# Patient Record
Sex: Female | Born: 1970 | Race: White | Hispanic: No | Marital: Single | Smoking: Never smoker
Health system: Southern US, Community
[De-identification: ages and names within clinical notes are randomized; demographics above are authoritative.]

## PROBLEM LIST (undated history)

## (undated) DIAGNOSIS — M545 Low back pain, unspecified: Secondary | ICD-10-CM

## (undated) DIAGNOSIS — I10 Essential (primary) hypertension: Secondary | ICD-10-CM

## (undated) HISTORY — DX: Essential (primary) hypertension: I10

## (undated) HISTORY — PX: WISDOM TOOTH EXTRACTION: SHX21

---

## 2001-02-20 ENCOUNTER — Other Ambulatory Visit: Admission: RE | Admit: 2001-02-20 | Discharge: 2001-02-20 | Payer: Self-pay | Admitting: *Deleted

## 2002-03-31 ENCOUNTER — Other Ambulatory Visit: Admission: RE | Admit: 2002-03-31 | Discharge: 2002-03-31 | Payer: Self-pay | Admitting: Obstetrics & Gynecology

## 2011-04-25 ENCOUNTER — Other Ambulatory Visit: Payer: Self-pay | Admitting: Family Medicine

## 2011-04-25 DIAGNOSIS — Z1231 Encounter for screening mammogram for malignant neoplasm of breast: Secondary | ICD-10-CM

## 2011-05-09 ENCOUNTER — Ambulatory Visit
Admission: RE | Admit: 2011-05-09 | Discharge: 2011-05-09 | Disposition: A | Discharge status: Home / Self Care | Payer: BC Managed Care – PPO | Source: Ambulatory Visit | Attending: Family Medicine | Admitting: Family Medicine

## 2011-05-09 DIAGNOSIS — Z1231 Encounter for screening mammogram for malignant neoplasm of breast: Secondary | ICD-10-CM

## 2011-09-18 ENCOUNTER — Other Ambulatory Visit: Payer: Self-pay | Admitting: Family Medicine

## 2011-09-18 ENCOUNTER — Other Ambulatory Visit (HOSPITAL_COMMUNITY)
Admission: RE | Admit: 2011-09-18 | Discharge: 2011-09-18 | Disposition: A | Discharge status: Home / Self Care | Payer: BC Managed Care – PPO | Source: Ambulatory Visit | Attending: Family Medicine | Admitting: Family Medicine

## 2011-09-18 DIAGNOSIS — Z124 Encounter for screening for malignant neoplasm of cervix: Secondary | ICD-10-CM | POA: Insufficient documentation

## 2011-09-18 DIAGNOSIS — Z1159 Encounter for screening for other viral diseases: Secondary | ICD-10-CM | POA: Insufficient documentation

## 2011-11-25 ENCOUNTER — Ambulatory Visit
Admission: RE | Admit: 2011-11-25 | Discharge: 2011-11-25 | Disposition: A | Discharge status: Home / Self Care | Payer: BC Managed Care – PPO | Source: Ambulatory Visit | Attending: Family Medicine | Admitting: Family Medicine

## 2011-11-25 ENCOUNTER — Other Ambulatory Visit: Payer: Self-pay | Admitting: Family Medicine

## 2011-11-25 DIAGNOSIS — R609 Edema, unspecified: Secondary | ICD-10-CM

## 2012-04-17 ENCOUNTER — Other Ambulatory Visit: Payer: Self-pay | Admitting: Family Medicine

## 2012-04-17 DIAGNOSIS — Z1231 Encounter for screening mammogram for malignant neoplasm of breast: Secondary | ICD-10-CM

## 2012-05-06 ENCOUNTER — Ambulatory Visit: Payer: BC Managed Care – PPO | Admitting: Sports Medicine

## 2012-05-11 ENCOUNTER — Ambulatory Visit: Payer: BC Managed Care – PPO

## 2012-05-29 ENCOUNTER — Ambulatory Visit
Admission: RE | Admit: 2012-05-29 | Discharge: 2012-05-29 | Disposition: A | Discharge status: Home / Self Care | Payer: BC Managed Care – PPO | Source: Ambulatory Visit | Attending: Family Medicine | Admitting: Family Medicine

## 2012-05-29 DIAGNOSIS — Z1231 Encounter for screening mammogram for malignant neoplasm of breast: Secondary | ICD-10-CM

## 2012-06-03 ENCOUNTER — Ambulatory Visit (INDEPENDENT_AMBULATORY_CARE_PROVIDER_SITE_OTHER): Payer: BC Managed Care – PPO | Admitting: Sports Medicine

## 2012-06-03 ENCOUNTER — Encounter: Payer: Self-pay | Admitting: Sports Medicine

## 2012-06-03 VITALS — BP 132/84 | HR 82

## 2012-06-03 DIAGNOSIS — M216X9 Other acquired deformities of unspecified foot: Secondary | ICD-10-CM | POA: Insufficient documentation

## 2012-06-03 DIAGNOSIS — M25476 Effusion, unspecified foot: Secondary | ICD-10-CM | POA: Insufficient documentation

## 2012-06-03 DIAGNOSIS — R269 Unspecified abnormalities of gait and mobility: Secondary | ICD-10-CM

## 2012-06-03 DIAGNOSIS — M79673 Pain in unspecified foot: Secondary | ICD-10-CM | POA: Insufficient documentation

## 2012-06-03 NOTE — Assessment & Plan Note (Signed)
Patient has swelling of the foot joint over the second and third metatarsal base. This is likely dependent to a certain extent but also on ultrasound patient does have a branch of an arterial vessel but does transverse this area. If supports to the transverse arch is given patient arterial seems to come out of the joint which is beneficial. I think this is likely contributing to her swelling. Patient was given arch support bandage today. Patient was also given a sports insoles with heel lift as well as scaphoid pad and metatarsal pads. Patient had improvements in pronation bilaterally.

## 2012-06-03 NOTE — Assessment & Plan Note (Signed)
Scaphoid pads added 

## 2012-06-03 NOTE — Assessment & Plan Note (Signed)
If patient does notice improvement with correction so we made with her sports insoles we will then consider custom orthotics. Patient will get in touch with [place] within 4 weeks if she would like this made.

## 2012-06-03 NOTE — Assessment & Plan Note (Signed)
Metatarsal pads added and will see if helps.

## 2012-06-03 NOTE — Progress Notes (Signed)
Chief complaint: Left foot swelling on dorsal aspect  History of present illness: 41 year old female with no significant past medical history coming in with left foot swelling. Patient states approximately 6 months ago when she was in aerobic class she stepped down from a step and had midfoot pain mostly on the medial or tibial aspect of the foot. This occurred on the left foot. Patient saw her primary care provider as well as a podiatrist who did put her in a rigid orthotics. Patient states with time the midfoot pain went away but unfortunately she still had dorsal swelling of the foot. Patient has had x-rays of his foot done which were reviewed by me today. Patient did have dorsal foot swelling but otherwise no osseous abnormalities or fracture seen. Patient states that the pain has almost completely resolved but unfortunately she still has swelling. Patient states that it is a pocket distal to the midfoot and points towards the second and third toes. Patient states that the swelling does not have a lot of pain but continues to be something that occurs throughout the day and is worse at night. Patient appears that it does get better by the morning time. Patient denies any swelling anywhere else including the ankle in cast, patient also denies any shortness of breath or chest pain. Patient denies any fevers or chills and is still able to do all her activities of daily living with this swelling including working out.   Review of systems as above  Past medical history is unremarkable Past surgical history none Allergies: Minocycline Social history: Patient does not smoke drinks occasionally Family history denies any family history of diabetes heart disease but her mother does have hypertension.  Physical exam Blood pressure 132/84, pulse 82. Gen: No apparent distress alert and oriented x3 mood and affect are normal Respiratory: Patient speaks in full sentences and does not appear short of breath Skin:  Warm dry intact no signs of infection or rash Neuro: Cranial nerves II through XII are intact neurovascularly intact in all extremities. Patient also has 2+ DTRs in all extremities Foot exam: On standing patient does have overpronation of the ankles and hindfoot bilaterally. Patient also does have breakdown of the longitudinal and transverse arch bilaterally. 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 Patient has trace effusion between the second and third toes on the left side compared to the right side. Patient is nontender on exam with 5 out of 5 strength at the ankle and neurovascularly intact distally. Gait analysis: Patient does walk with overpronation still of the hindfoot bilaterally. Patient otherwise has a fairly normal gait with mild external rotation of the right tibia.  Ultrasound was done and interpreted by me today. Patient's ultrasound over the navicular bone on the dorsal aspect shows an area where she does have what appears to be hypoechoic changes with calcific changes representing a likely a healed stress reaction versus a ligamentous injury. When transverse in between the second and third toes patient does have a branch of an arterial vessel that can be seen within the joint. Otherwise scan is fairly unremarkable.

## 2013-02-16 ENCOUNTER — Ambulatory Visit: Payer: Self-pay | Admitting: Obstetrics and Gynecology

## 2013-02-23 ENCOUNTER — Encounter: Payer: Self-pay | Admitting: Obstetrics and Gynecology

## 2013-02-25 ENCOUNTER — Encounter: Payer: Self-pay | Admitting: Obstetrics and Gynecology

## 2013-02-26 ENCOUNTER — Other Ambulatory Visit: Payer: Self-pay | Admitting: Dermatology

## 2013-02-26 ENCOUNTER — Encounter: Payer: Self-pay | Admitting: Obstetrics and Gynecology

## 2013-02-26 ENCOUNTER — Ambulatory Visit (INDEPENDENT_AMBULATORY_CARE_PROVIDER_SITE_OTHER): Payer: BC Managed Care – PPO | Admitting: Obstetrics and Gynecology

## 2013-02-26 VITALS — BP 130/80 | HR 70 | Ht 66.0 in | Wt 206.0 lb

## 2013-02-26 DIAGNOSIS — Z01419 Encounter for gynecological examination (general) (routine) without abnormal findings: Secondary | ICD-10-CM

## 2013-02-26 DIAGNOSIS — Z0000 Encounter for general adult medical examination without abnormal findings: Secondary | ICD-10-CM

## 2013-02-26 DIAGNOSIS — R1900 Intra-abdominal and pelvic swelling, mass and lump, unspecified site: Secondary | ICD-10-CM

## 2013-02-26 LAB — POCT URINALYSIS DIPSTICK
Glucose, UA: NEGATIVE
Nitrite, UA: NEGATIVE
Urobilinogen, UA: NEGATIVE
pH, UA: 5

## 2013-02-26 LAB — CBC WITH DIFFERENTIAL/PLATELET
Basophils Relative: 1 % (ref 0–1)
Eosinophils Absolute: 0.1 10*3/uL (ref 0.0–0.7)
MCH: 29.3 pg (ref 26.0–34.0)
MCHC: 33.3 g/dL (ref 30.0–36.0)
Neutrophils Relative %: 66 % (ref 43–77)
Platelets: 236 10*3/uL (ref 150–400)

## 2013-02-26 NOTE — Progress Notes (Signed)
Patient ID: Samantha Schmitt, female   DOB: 18-Jun-1971, 42 y.o.   MRN: [id number] 42 y.o.   Single    Caucasian   female   LMP 02/25/13 G0P0   here for annual exam.    Regular menses.  No cramps.  Some bloating.   One or two hot flashes.  No night sweats.    Lost almost 50 pounds due to running and training. Ran a 1/2 marathon.   Patient's last menstrual period was 02/25/2013.          Sexually active: no  The current method of family planning is abstinence.    Exercising: running Last mammogram:  05/2012 -  [facility] - normal. Last pap smear: 11/2011 wnl.  High risk HPV testing. History of abnormal pap: no Smoking: no Alcohol: no Last colonoscopy: never Last Bone Density:  never Last tetanus shot: 2010 Last cholesterol check: with PCP 11/2011  Hgb:                Urine: 1+ RBC's(see LMP)   Family History  Problem Relation Age of Onset  . Hypertension Mother   . Thyroid disease Mother     Patient Active Problem List   Diagnosis Date Noted  . 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 Foot pain 06/03/2012  . Pronation of feet 06/03/2012  . Gait abnormality 06/03/2012  . Swelling of foot joint 06/03/2012    History reviewed. No pertinent past medical history.  Past Surgical History  Procedure Laterality Date  . Wisdom tooth extraction      Allergies: Minocycline  Current Outpatient Prescriptions  Medication Sig Dispense Refill  . Multiple Vitamin (MULTIVITAMIN) capsule Take 1 capsule by mouth daily.       No current facility-administered medications for this visit.    ROS: Pertinent items are noted in HPI.  Social Hx:    Exam:    BP 130/80  Pulse 70  Ht 5\' 6"  (1.676 m)  Wt 206 lb (93.441 kg)  BMI 33.27 kg/m2  LMP 02/25/2013   Wt Readings from Last 3 Encounters:  02/26/13 206 lb (93.441 kg)     Ht Readings from Last 3 Encounters:  02/26/13 5\' 6"  (1.676 m)    General appearance: alert, cooperative and appears stated age Head: Normocephalic,  without obvious abnormality, atraumatic Neck: no adenopathy, supple, symmetrical, trachea midline and thyroid not enlarged, symmetric, no tenderness/mass/nodules Lungs: clear to auscultation bilaterally Breasts: Inspection negative, No nipple retraction or dimpling, No nipple discharge or bleeding, No axillary or supraclavicular adenopathy, Normal to palpation without dominant masses Heart: regular rate and rhythm Abdomen: soft, non-tender; bowel sounds normal; no masses,  no organomegaly Extremities: extremities normal, atraumatic, no cyanosis or edema Skin: Skin color, texture, turgor normal. No rashes or lesions Lymph nodes: Cervical, supraclavicular, and axillary nodes normal. No abnormal inguinal nodes palpated Neurologic: Grossly normal   Pelvic: External genitalia:  no lesions              Urethra:  normal appearing urethra with no masses, tenderness or lesions              Bartholins and Skenes: normal                 Vagina: normal appearing vagina with normal color and discharge, no lesions              Cervix: normal appearance or posterior lip of cervix.  Unable to tolerate speculum exam well.  Pap taken: no        Bimanual Exam:  Uterus:  20 week size midline mass                                      Adnexa:  Unable to feel adnexa separately from the mass.                                      Rectovaginal: Confirms                                      Anus:  normal sphincter tone, no lesions  Assessment  Pelvic mass.  I suspect large uterine fibroids.   P: mammogram yearly pap smear per guidelines CBC, CMP Return for pelvic ultrasound and renal ultrasound in office to define pelvic anatomy and rule out hydronephrosis. I had a preliminary discussion with the patient regarding uterine fibroids and possible treatment options. I asked the patient to think about future fertility options as we progress through her evaluation. return annually or prn     An After  Visit Summary was printed and given to the patient.

## 2013-02-26 NOTE — Patient Instructions (Addendum)

## 2013-02-27 LAB — COMPREHENSIVE METABOLIC PANEL
ALT: 13 U/L (ref 0–35)
Alkaline Phosphatase: 39 U/L (ref 39–117)
Sodium: 139 mEq/L (ref 135–145)
Total Bilirubin: 0.3 mg/dL (ref 0.3–1.2)
Total Protein: 6.9 g/dL (ref 6.0–8.3)

## 2013-03-05 ENCOUNTER — Other Ambulatory Visit: Payer: Self-pay | Admitting: Orthopedic Surgery

## 2013-03-23 ENCOUNTER — Telehealth: Payer: Self-pay | Admitting: Obstetrics and Gynecology

## 2013-03-23 NOTE — Telephone Encounter (Signed)
Routed information to [employer].

## 2013-03-23 NOTE — Telephone Encounter (Signed)
Patient would like to reschedule PUS/Consult appt. Scheduled for 03/24/13.

## 2013-03-25 ENCOUNTER — Other Ambulatory Visit: Payer: BC Managed Care – PPO | Admitting: Obstetrics and Gynecology

## 2013-03-25 ENCOUNTER — Other Ambulatory Visit: Payer: BC Managed Care – PPO

## 2013-03-29 ENCOUNTER — Telehealth: Payer: Self-pay | Admitting: *Deleted

## 2013-03-29 NOTE — Telephone Encounter (Signed)
appt 04-01-13

## 2013-03-29 NOTE — Telephone Encounter (Signed)
PUS resched to this Thurs 04-01-13.

## 2013-04-01 ENCOUNTER — Ambulatory Visit (INDEPENDENT_AMBULATORY_CARE_PROVIDER_SITE_OTHER): Payer: BC Managed Care – PPO | Admitting: Obstetrics and Gynecology

## 2013-04-01 ENCOUNTER — Encounter: Payer: Self-pay | Admitting: Obstetrics and Gynecology

## 2013-04-01 ENCOUNTER — Other Ambulatory Visit: Payer: Self-pay | Admitting: Obstetrics and Gynecology

## 2013-04-01 ENCOUNTER — Ambulatory Visit (INDEPENDENT_AMBULATORY_CARE_PROVIDER_SITE_OTHER): Payer: BC Managed Care – PPO

## 2013-04-01 VITALS — BP 130/82 | HR 70 | Ht 66.0 in | Wt 209.5 lb

## 2013-04-01 DIAGNOSIS — D219 Benign neoplasm of connective and other soft tissue, unspecified: Secondary | ICD-10-CM

## 2013-04-01 DIAGNOSIS — D259 Leiomyoma of uterus, unspecified: Secondary | ICD-10-CM

## 2013-04-01 DIAGNOSIS — R1900 Intra-abdominal and pelvic swelling, mass and lump, unspecified site: Secondary | ICD-10-CM

## 2013-04-01 NOTE — Patient Instructions (Signed)
Please refer to your handout on uterine fibroids.

## 2013-04-01 NOTE — Progress Notes (Signed)
Subjective  Patient is here for pelvic ultrasound for suspected uterine fibroids. Is now more aware of the her enlarged uterus. Reports her menses are heavy but just thought this was normal.  Bleeds heavily for 2 days per month.  Hgb 13.4 one month ago. Rare cramping. Bloating.  Takes Midol or pamprin. Back pain, at times very significant.  Has a diagnosis of an extra vertebra.  Sees chiropractor.   Notes back pain with her menses.  Some bladder frequency.  Improved since decreased Diet Coke use.   Would like to consider parenting in some form.  Patient's father having a foot amputation next week.  He had a sprain and then developed osteomyelitis which is not related to any diabetes.   Objective  General - tearful when taking about her father's health issues and now her fibroids.   Pelvic exam -  Retroverted uterus.  20 week size.  Difficult to tell of the left lower quadrant fibroid is just above the level of the cervical os or not.   Pelvic ultrasound - see below  2 large intramural fibroids - left lower 9 cm, right upper 8 cm. A possible third fibroid was seen but no formally documented. Ovaries normal.   Renal ultrasound - no dilation of collecting systems.    Assessment  Large uterine fibroids - symptomatic. Consideration for potential future fertility.  Plan  The patient and I discussed options for treatment including contraception (pills, Depo Provera, et.), Depo Lupron, uterine artery embolization, MRI guided ultrasound therapy at a university, abdominal  Myomectomy, and abdominal hysterectomy.  We discussed benefits and risks of options.  At this point, patient is most interested in abdominal myomectomy.   We reviewed specific risks including fevers post op, bleeding requiring transfusion or hysterectomy, development of adhesive disease and decreased fertility related to this, uterine rupture, and need for future cesarean section.  Patient will return in 3 months  to have a follow up ultrasound and further discussion.   She wants to focus on her father right now and proceed through the Holiday season without any intervention if possible.

## 2013-04-16 ENCOUNTER — Telehealth: Payer: Self-pay | Admitting: Obstetrics and Gynecology

## 2013-04-16 NOTE — Telephone Encounter (Signed)
LMTCB to schedule 3 month repeat scan. (November)

## 2013-04-22 NOTE — Telephone Encounter (Signed)
Patient was returning someone's call was not sure who contacted her.

## 2013-04-22 NOTE — Telephone Encounter (Signed)
Routed to Carolyn/cm

## 2013-04-27 NOTE — Telephone Encounter (Signed)
Spoke with patient. Scheduled Samantha Schmitt for a 3 month follow up PUS on 11/20.

## 2013-06-17 ENCOUNTER — Other Ambulatory Visit: Payer: Self-pay

## 2013-06-23 ENCOUNTER — Telehealth: Payer: Self-pay | Admitting: Obstetrics and Gynecology

## 2013-06-23 NOTE — Telephone Encounter (Signed)
Patient calling to check on status of pre-cert for ultrasound.

## 2013-06-29 NOTE — Telephone Encounter (Signed)
Patient had to cancel her pus because she started her cycle. Can you call her to reschedule.

## 2013-07-01 ENCOUNTER — Other Ambulatory Visit: Payer: BC Managed Care – PPO | Admitting: Obstetrics and Gynecology

## 2013-07-01 ENCOUNTER — Other Ambulatory Visit: Payer: BC Managed Care – PPO

## 2013-07-02 NOTE — Telephone Encounter (Signed)
LMTCB to assist with rescheduling her PUS.

## 2013-07-20 ENCOUNTER — Other Ambulatory Visit: Payer: Self-pay

## 2013-07-20 DIAGNOSIS — Z1231 Encounter for screening mammogram for malignant neoplasm of breast: Secondary | ICD-10-CM

## 2013-08-13 ENCOUNTER — Ambulatory Visit
Admission: RE | Admit: 2013-08-13 | Discharge: 2013-08-13 | Disposition: A | Discharge status: Home / Self Care | Payer: BC Managed Care – PPO | Source: Ambulatory Visit

## 2013-08-13 DIAGNOSIS — Z1231 Encounter for screening mammogram for malignant neoplasm of breast: Secondary | ICD-10-CM

## 2013-08-19 ENCOUNTER — Other Ambulatory Visit: Payer: Self-pay | Admitting: Family Medicine

## 2013-08-19 DIAGNOSIS — R928 Other abnormal and inconclusive findings on diagnostic imaging of breast: Secondary | ICD-10-CM

## 2013-08-25 ENCOUNTER — Telehealth: Payer: Self-pay | Admitting: Obstetrics and Gynecology

## 2013-08-25 NOTE — Telephone Encounter (Signed)
Patient is calling Sabrina back °

## 2013-08-25 NOTE — Telephone Encounter (Signed)
Voicemail confirmed mobile #/ left message for patient to call back to schedule PUS/ pr $[email or web address]//ssf

## 2013-08-27 ENCOUNTER — Ambulatory Visit
Admission: RE | Admit: 2013-08-27 | Discharge: 2013-08-27 | Disposition: A | Discharge status: Home / Self Care | Payer: BC Managed Care – PPO | Source: Ambulatory Visit | Attending: Family Medicine | Admitting: Family Medicine

## 2013-08-27 DIAGNOSIS — R928 Other abnormal and inconclusive findings on diagnostic imaging of breast: Secondary | ICD-10-CM

## 2013-09-03 NOTE — Telephone Encounter (Signed)
Patient returned call/ scheduled pus/ advised of copay, cancellation policy and cancellation fee./ssf

## 2013-09-23 ENCOUNTER — Ambulatory Visit (INDEPENDENT_AMBULATORY_CARE_PROVIDER_SITE_OTHER): Payer: BC Managed Care – PPO | Admitting: Obstetrics and Gynecology

## 2013-09-23 ENCOUNTER — Encounter: Payer: Self-pay | Admitting: Obstetrics and Gynecology

## 2013-09-23 ENCOUNTER — Ambulatory Visit (INDEPENDENT_AMBULATORY_CARE_PROVIDER_SITE_OTHER): Payer: BC Managed Care – PPO

## 2013-09-23 VITALS — BP 134/90 | Wt 212.0 lb

## 2013-09-23 DIAGNOSIS — D219 Benign neoplasm of connective and other soft tissue, unspecified: Secondary | ICD-10-CM

## 2013-09-23 DIAGNOSIS — D259 Leiomyoma of uterus, unspecified: Secondary | ICD-10-CM

## 2013-09-23 NOTE — Patient Instructions (Signed)
Call if you develop increasing pressure, increased or irregular bleeding, back pain, or urinary frequency.

## 2013-09-23 NOTE — Progress Notes (Signed)
Subjective  Patient is here for pelvic ultrasound follow up of large uterine fibroids.  Menses lasting 7 - 8 days, not heavy.  Some pressure with menses. Decline future childbearing.  Objective  Ultrasound   4 fibroids -  6.2 cm, 4.1 cm - 2 separate fibroids, left, lateral, previously seen as one fibroid.  No change overall.  8.1 cm - stable  2.56 cm - right, newly measured fibroid  Endometrium distorted.  Ovaries normal.    Assessment  Stable uterine fibroids.  Relatively asymptomatic.   Plan  Discussed options for care - hormonal contraceptives for control of bleeding, myomectomy, uterine artery embolization, hysterectomy.   I discussed low risk of hidden sarcoma. Patient elects observation at this time.  Follow up in July 2015 for annual examination. May do an additional 6 months follow up ultrasound.  15 minutes face to face time of which over 505 was spent in counseling.   After visit summary to patient.

## 2013-11-24 ENCOUNTER — Encounter: Payer: Self-pay | Admitting: Obstetrics and Gynecology

## 2013-11-30 ENCOUNTER — Other Ambulatory Visit: Payer: Self-pay | Admitting: Chiropractic Medicine

## 2013-11-30 DIAGNOSIS — M531 Cervicobrachial syndrome: Secondary | ICD-10-CM

## 2013-12-01 ENCOUNTER — Other Ambulatory Visit: Payer: BC Managed Care – PPO

## 2013-12-03 ENCOUNTER — Ambulatory Visit
Admission: RE | Admit: 2013-12-03 | Discharge: 2013-12-03 | Disposition: A | Discharge status: Home / Self Care | Payer: BC Managed Care – PPO | Source: Ambulatory Visit | Attending: Chiropractic Medicine | Admitting: Chiropractic Medicine

## 2013-12-03 DIAGNOSIS — M531 Cervicobrachial syndrome: Secondary | ICD-10-CM

## 2014-03-04 ENCOUNTER — Ambulatory Visit: Payer: BC Managed Care – PPO | Admitting: Obstetrics and Gynecology

## 2014-03-11 ENCOUNTER — Ambulatory Visit (INDEPENDENT_AMBULATORY_CARE_PROVIDER_SITE_OTHER): Payer: BC Managed Care – PPO | Admitting: Obstetrics and Gynecology

## 2014-03-11 ENCOUNTER — Encounter: Payer: Self-pay | Admitting: Obstetrics and Gynecology

## 2014-03-11 VITALS — BP 128/82 | HR 72 | Ht 66.0 in | Wt 211.0 lb

## 2014-03-11 DIAGNOSIS — D251 Intramural leiomyoma of uterus: Secondary | ICD-10-CM

## 2014-03-11 DIAGNOSIS — Z01419 Encounter for gynecological examination (general) (routine) without abnormal findings: Secondary | ICD-10-CM

## 2014-03-11 LAB — POCT URINALYSIS DIPSTICK
Bilirubin, UA: NEGATIVE
Blood, UA: NEGATIVE
Glucose, UA: NEGATIVE
LEUKOCYTES UA: NEGATIVE
NITRITE UA: NEGATIVE
PROTEIN UA: NEGATIVE
Urobilinogen, UA: NEGATIVE
pH, UA: 7

## 2014-03-11 NOTE — Patient Instructions (Signed)
EXERCISE AND DIET:  We recommended that you start or continue a regular exercise program for good health. Regular exercise means any activity that makes your heart beat faster and makes you sweat.  We recommend exercising at least 30 minutes per day at least 3 days a week, preferably 4 or 5.  We also recommend a diet low in fat and sugar.  Inactivity, poor dietary choices and obesity can cause diabetes, heart attack, stroke, and kidney damage, among others.    ALCOHOL AND SMOKING:  Women should limit their alcohol intake to no more than 7 drinks/beers/glasses of wine (combined, not each!) per week. Moderation of alcohol intake to this level decreases your risk of breast cancer and liver damage. And of course, no recreational drugs are part of a healthy lifestyle.  And absolutely no smoking or even second hand smoke. Most people know smoking can cause heart and lung diseases, but did you know it also contributes to weakening of your bones? Aging of your skin?  Yellowing of your teeth and nails?  CALCIUM AND VITAMIN D:  Adequate intake of calcium and Vitamin D are recommended.  The recommendations for exact amounts of these supplements seem to change often, but generally speaking 600 mg of calcium (either carbonate or citrate) and 800 units of Vitamin D per day seems prudent. Certain women may benefit from higher intake of Vitamin D.  If you are among these women, your doctor will have told you during your visit.    PAP SMEARS:  Pap smears, to check for cervical cancer or precancers,  have traditionally been done yearly, although recent scientific advances have shown that most women can have pap smears less often.  However, every woman still should have a physical exam from her gynecologist every year. It will include a breast check, inspection of the vulva and vagina to check for abnormal growths or skin changes, a visual exam of the cervix, and then an exam to evaluate the size and shape of the uterus and  ovaries.  And after 43 years of age, a rectal exam is indicated to check for rectal cancers. We will also provide age appropriate advice regarding health maintenance, like when you should have certain vaccines, screening for sexually transmitted diseases, bone density testing, colonoscopy, mammograms, etc.   MAMMOGRAMS:  All women over 40 years old should have a yearly mammogram. Many facilities now offer a "3D" mammogram, which may cost around $50 extra out of pocket. If possible,  we recommend you accept the option to have the 3D mammogram performed.  It both reduces the number of women who will be called back for extra views which then turn out to be normal, and it is better than the routine mammogram at detecting truly abnormal areas.    COLONOSCOPY:  Colonoscopy to screen for colon cancer is recommended for all women at age 50.  We know, you hate the idea of the prep.  We agree, BUT, having colon cancer and not knowing it is worse!!  Colon cancer so often starts as a polyp that can be seen and removed at colonscopy, which can quite literally save your life!  And if your first colonoscopy is normal and you have no family history of colon cancer, most women don't have to have it again for 10 years.  Once every ten years, you can do something that may end up saving your life, right?  We will be happy to help you get it scheduled when you are ready.    Be sure to check your insurance coverage so you understand how much it will cost.  It may be covered as a preventative service at no cost, but you should check your particular policy.     Leuprolide depot injection or implant What is this medicine? LEUPROLIDE (loo PROE lide) is a man-made protein that acts like a natural hormone in the body. It decreases testosterone in men and decreases estrogen in women. In men, this medicine is used to treat advanced prostate cancer. In women, some forms of this medicine may be used to treat endometriosis, uterine fibroids,  or other female hormone-related problems. This medicine may be used for other purposes; ask your health care provider or pharmacist if you have questions. COMMON BRAND NAME(S): Eligard, Lupron Depot, Lupron Depot-Ped, Viadur What should I tell my health care provider before I take this medicine? They need to know if you have any of these conditions: -diabetes -heart disease or previous heart attack -high blood pressure -high cholesterol -osteoporosis -pain or difficulty passing urine -spinal cord metastasis -stroke -tobacco smoker -unusual vaginal bleeding (women) -an unusual or allergic reaction to leuprolide, benzyl alcohol, other medicines, foods, dyes, or preservatives -pregnant or trying to get pregnant -breast-feeding How should I use this medicine? This medicine is for injection into a muscle or for implant or injection under the skin. It is given by a health care professional in a hospital or clinic setting. The specific product will determine how it will be given to you. Make sure you understand which product you receive and how often you will receive it. Talk to your pediatrician regarding the use of this medicine in children. Special care may be needed. Overdosage: If you think you have taken too much of this medicine contact a poison control center or emergency room at once. NOTE: This medicine is only for you. Do not share this medicine with others. What if I miss a dose? It is important not to miss a dose. Call your doctor or health care professional if you are unable to keep an appointment. Depot injections: Depot injections are given either once-monthly, every 12 weeks, every 16 weeks, or every 24 weeks depending on the product you are prescribed. The product you are prescribed will be based on if you are female or female, and your condition. Make sure you understand your product and dosing. Implant dosing: The implant is removed and replaced once a year. The implant is only  used in males. What may interact with this medicine? Do not take this medicine with any of the following medications: -chasteberry This medicine may also interact with the following medications: -herbal or dietary supplements, like black cohosh or DHEA -female hormones, like estrogens or progestins and birth control pills, patches, rings, or injections -female hormones, like testosterone This list may not describe all possible interactions. Give your health care provider a list of all the medicines, herbs, non-prescription drugs, or dietary supplements you use. Also tell them if you smoke, drink alcohol, or use illegal drugs. Some items may interact with your medicine. What should I watch for while using this medicine? Visit your doctor or health care professional for regular checks on your progress. During the first weeks of treatment, your symptoms may get worse, but then will improve as you continue your treatment. You may get hot flashes, increased bone pain, increased difficulty passing urine, or an aggravation of nerve symptoms. Discuss these effects with your doctor or health care professional, some of them may improve with continued  use of this medicine. Female patients may experience a menstrual cycle or spotting during the first months of therapy with this medicine. If this continues, contact your doctor or health care professional. What side effects may I notice from receiving this medicine? Side effects that you should report to your doctor or health care professional as soon as possible: -allergic reactions like skin rash, itching or hives, swelling of the face, lips, or tongue -breathing problems -chest pain -depression or memory disorders -pain in your legs or groin -pain at site where injected or implanted -severe headache -swelling of the feet and legs -visual changes -vomiting Side effects that usually do not require medical attention (report to your doctor or health care  professional if they continue or are bothersome): -breast swelling or tenderness -decrease in sex drive or performance -diarrhea -hot flashes -loss of appetite -muscle, joint, or bone pains -nausea -redness or irritation at site where injected or implanted -skin problems or acne This list may not describe all possible side effects. Call your doctor for medical advice about side effects. You may report side effects to FDA at 1-800-FDA-1088. Where should I keep my medicine? This drug is given in a hospital or clinic and will not be stored at home. NOTE: This sheet is a summary. It may not cover all possible information. If you have questions about this medicine, talk to your doctor, pharmacist, or health care provider.  2015, Elsevier/Gold Standard. (2010-01-30 14:41:21)  Uterine Artery Embolization for Fibroids Uterine artery embolization is a nonsurgical treatment to shrink fibroids. A thin plastic tube (catheter) is used to inject material that blocks off the blood supply to the fibroid, which causes the fibroid to shrink. LET [facility] CARE PROVIDER KNOW ABOUT:  Any allergies you have.  All medicines you are taking, including vitamins, herbs, eye drops, creams, and over-the-counter medicines.  Previous problems you or members of your family have had with the use of anesthetics.  Any blood disorders you have.  Previous surgeries you have had.  Medical conditions you have. RISKS AND COMPLICATIONS  Injury to the uterus from decreased blood supply  Infection.  Blood infection (septicemia).  Lack of menstrual periods (amenorrhea).  Death of tissue cells (necrosis) around your bladder or vulva.  Development of a hole between organs or from an organ to the surface of your skin (fistula).  Blood clot in the legs (deep vein thrombosis) or lung (pulmonary embolus). BEFORE THE PROCEDURE  Ask your health care provider about changing or stopping your regular medicines.   Do  not take aspirin or blood thinners (anticoagulants) for 1 week before the surgery or as directed by your health care provider.  Do not eat or drink anything for 8 hours before the surgery or as directed by your health care provider.   Empty your bladder before the procedure begins. PROCEDURE   An IV tube will be placed into one of your veins. This will be used to give you a sedative and pain medication (conscious sedation).  You will be given a medicine that numbs the area (local anesthetic).  A small cut will be made in your groin. A catheter is then inserted into the main artery of your leg.  The catheter will be guided through the artery to your uterus. A series of images will be taken while dye is injected through the catheter in your groin. X-rays are taken at the same time. This is done to provide a road map of the blood supply to your  uterus and fibroids.  Tiny plastic spheres, about the size of sand grains, will be injected through the catheter. Metal coils may be used to help block the artery. The particles will lodge in tiny branches of the uterine artery that supplies blood to the fibroids.  The procedure is repeated on the artery that supplies the other side of the uterus.  The catheter is then removed and pressure is held to stop any bleeding. No stitches are needed.  A dressing is then placed over the cut (incision). AFTER THE PROCEDURE  You will be taken to a recovery area where your progress will be monitored until you are awake, stable, and taking fluids well. If there are no other problems, you will then be moved to a regular hospital room.  You will be observed overnight in the hospital.  You will have cramping that should be controlled with pain medication. Document Released: 10/14/2005 Document Revised: 05/19/2013 Document Reviewed: 02/11/2013 [facility] Patient Information 2015 [place], [place]. This information is not intended to replace advice given to you by your  health care provider. Make sure you discuss any questions you have with your health care provider.

## 2014-03-11 NOTE — Progress Notes (Signed)
GYNECOLOGY VISIT  PCP: Dr.McNeil  Referring provider:   HPI: 43 y.o.   Single  Caucasian  female   G0P0 with Patient's last menstrual period was 02/26/2014.   here for   Annual  Menses 8 days and regular.  No heavy or crampy bleeding.  Some low  Back pain.  Has large uterine fibroids and is planning for eventual hysterectomy. Not planning on future childbearing.   Has a pinched nerve of the left foot.  Sees [facility]. Going to [facility].   Hgb:  Labs will do with PCP. Urine:  Moderate ketones ph-7.0  GYNECOLOGIC HISTORY: Patient's last menstrual period was 02/26/2014. Sexually active:  no Partner preference:female  Contraception:   none Menopausal hormone therapy: no  DES exposure:   no Blood transfusions:no    Sexually transmitted diseases:   no GYN procedures and prior surgeries:  no Last mammogram:  08/27/13 BI-RADS Neg               Last pap and high risk HPV testing:   11/2011 wnl High risk HPV testing History of abnormal pap smear:  no   OB History   Grav Para Term Preterm Abortions TAB SAB Ect Mult Living   0                LIFESTYLE: Exercise:    Yes, walking and running           Tobacco: no Alcohol:no Drug use:no    OTHER HEALTH MAINTENANCE: Tetanus/TDap: 2014 - PCP Gardisil:no Influenza: 10/14  Zostavax:no  Bone density:no Colonoscopy:no  Cholesterol check: PCP  Family History  Problem Relation Age of Onset  . Hypertension Mother   . Thyroid disease Mother     Patient Active Problem List   Diagnosis Date Noted  . Fibroids 09/23/2013  . 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 Foot pain 06/03/2012  . Pronation of feet 06/03/2012  . Gait abnormality 06/03/2012  . Swelling of foot joint 06/03/2012   History reviewed. No pertinent past medical history.  Past Surgical History  Procedure Laterality Date  . Wisdom tooth extraction      ALLERGIES: Minocycline  Current Outpatient Prescriptions  Medication Sig  Dispense Refill  . Methylsulfonylmethane (MSM PO) Take by mouth daily.      . Multiple Vitamin (MULTIVITAMIN) capsule Take 1 capsule by mouth daily.      . Omega-3 Fatty Acids (FISH OIL) 1000 MG CAPS Take by mouth. Take one every other day      . vitamin E 200 UNIT capsule Take 200 Units by mouth daily.       No current facility-administered medications for this visit.     ROS:  Pertinent items are noted in HPI.  SOCIAL HISTORY:  Company secretary.  PHYSICAL EXAMINATION:    BP 128/82  Pulse 72  Ht 5\' 6"  (1.676 m)  Wt 211 lb (95.709 kg)  BMI 34.07 kg/m2  LMP 02/26/2014   Wt Readings from Last 3 Encounters:  03/11/14 211 lb (95.709 kg)  09/23/13 212 lb (96.163 kg)  04/01/13 209 lb 8 oz (95.029 kg)     Ht Readings from Last 3 Encounters:  03/11/14 5\' 6"  (1.676 m)  04/01/13 5\' 6"  (1.676 m)  02/26/13 5\' 6"  (1.676 m)    General appearance: alert, cooperative and appears stated age Head: Normocephalic, without obvious abnormality, atraumatic Neck: no adenopathy, supple, symmetrical, trachea midline and thyroid not enlarged, symmetric, no tenderness/mass/nodules Lungs: clear to auscultation bilaterally Breasts: Inspection negative, No  nipple retraction or dimpling, No nipple discharge or bleeding, No axillary or supraclavicular adenopathy, Normal to palpation without dominant masses Heart: regular rate and rhythm Abdomen: soft, non-tender;  Uterus 20 week size, left fundus slightly above the umbilicus.  Extremities: extremities normal, atraumatic, no cyanosis or edema Skin: Skin color, texture, turgor normal. No rashes or lesions Lymph nodes: Cervical, supraclavicular, and axillary nodes normal. No abnormal inguinal nodes palpated Neurologic: Grossly normal  Pelvic: External genitalia:  no lesions              Urethra:  normal appearing urethra with no masses, tenderness or lesions              Bartholins and Skenes: normal                 Vagina: normal appearing vagina with normal  color and discharge, no lesions              Cervix: normal appearance              Pap and high risk HPV testing done: No..            Bimanual Exam:  Uterus:  uterus is  20 - 21 week size.                                       Adnexa: normal adnexa in size, nontender and no masses                                      Rectovaginal: Confirms                                      Anus:  normal sphincter tone, no lesions  ASSESSMENT  Normal gynecologic exam. Large uterine fibroids.   PLAN  Mammogram recommended yearly.  Pap smear and high risk HPV testing not indicated.  Counseled on self breast exam, Calcium and vitamin D intake, exercise. Discussed uterine fibroids and hysterectomy vs. Uterine artery embolization.  I recommend Depo Lupron for 3 - 6 months prior to procedure to shrink uterus and fibroids.  I discussed side effects and gave information to patient.  Will start precert for Depo Lupron.  Tentative hysterectomy for January, February, or March per patient.  Return annually or prn   An After Visit Summary was printed and given to the patient.

## 2014-03-30 ENCOUNTER — Telehealth: Payer: Self-pay | Admitting: *Deleted

## 2014-03-30 NOTE — Telephone Encounter (Signed)
Faxed authorization for Lupron received from [facility]. Call to patient to check on shipment and cycle expected dates. LMTCB.

## 2014-03-31 NOTE — Telephone Encounter (Signed)
[facility] calling, They have authorization from patient for shipment. Advised can ship at first available time per Gay Filler so that Lupron is here for patient's next menstrual cycle.  They will ship tomorrow for arrival on Tuesday. Pt to call with first day of menses for nurse appointment for injection within first 5 days of cycle. Patient notified and she is agreeable to instructions.  Will call back on first day of menses.

## 2014-04-01 NOTE — Telephone Encounter (Signed)
Great! Thanks for the update.

## 2014-04-19 ENCOUNTER — Telehealth: Payer: Self-pay | Admitting: Obstetrics and Gynecology

## 2014-04-19 NOTE — Telephone Encounter (Signed)
Spoke with patient. Patient started cycle on 9/5. Appointment scheduled for tomorrow at 9:30am for nurse visit for lupron injection. Agreeable to date and time.  Routing to provider for final review. Patient agreeable to disposition. Will close encounter

## 2014-04-19 NOTE — Telephone Encounter (Signed)
Patient started her cycle 04/16/14 and is calling to schedule "Depo Lupron."

## 2014-04-19 NOTE — Telephone Encounter (Signed)
Patient is returning a call to Kaitlyn. °

## 2014-04-19 NOTE — Telephone Encounter (Signed)
Left message to call [facility] at [phone]. Need to get patient in by tomorrow for nurse visit for Lupron injection.

## 2014-04-20 ENCOUNTER — Ambulatory Visit (INDEPENDENT_AMBULATORY_CARE_PROVIDER_SITE_OTHER): Payer: BC Managed Care – PPO

## 2014-04-20 VITALS — BP 138/90 | HR 72 | Ht 66.0 in | Wt 213.0 lb

## 2014-04-20 DIAGNOSIS — D259 Leiomyoma of uterus, unspecified: Secondary | ICD-10-CM

## 2014-04-20 MED ORDER — LEUPROLIDE ACETATE (3 MONTH) 11.25 MG IM KIT
11.2500 mg | PACK | Freq: Once | INTRAMUSCULAR | Status: AC
  Administered 2014-04-20: 11.25 mg via INTRAMUSCULAR

## 2014-04-20 MED ORDER — LEUPROLIDE ACETATE (3 MONTH) 11.25 MG IM KIT: 11.2500 mg | PACK | INTRAMUSCULAR | Status: DC

## 2014-04-20 NOTE — Progress Notes (Signed)
Pt is here for a Lupron injection. Pt cycle started Sept. 5th, 2015. Pt was told by Gay Filler to come in within 5 days of her cycle. Pt waited 20 mins for any reactions. Pt tolerated injection well.

## 2014-05-13 ENCOUNTER — Telehealth: Payer: Self-pay | Admitting: Obstetrics and Gynecology

## 2014-05-13 NOTE — Telephone Encounter (Signed)
Pharmacy called to verify whether patient still needs Lupron. There was a note on the prescription to verify before processing. They pharmacist said when we call back to just say "cancel or keep processing" as no other information is required.

## 2014-05-16 NOTE — Telephone Encounter (Signed)
Patient will receive only the two injections in total. Will need to start thinking about surgery for January 2016 for total abdominal hysterectomy with bilateral salpingectomy for symptomatic fibroids.  I am not certain of the patient's insurance and how this will affect any pre-certification process. Thanks.   Cc - Sabrina Felisa Bonier

## 2014-05-16 NOTE — Telephone Encounter (Signed)
Dr. Quincy Simmonds,  Will patient continue with 6 months of Therapy for Lupron?  Patient had injection on 04/20/14, will need second injection after 07/20/14?

## 2014-05-17 ENCOUNTER — Telehealth: Payer: Self-pay | Admitting: *Deleted

## 2014-05-17 NOTE — Telephone Encounter (Signed)
I would recommend proceeding with the second injection, as her surgery will be in January at the soonest. A follow up visit in December will be helpful to re-examine the patient and prepare for surgery after the New Year, i.e. Make final decision regarding abdominal or laparoscopic approach to hysterectomy.  Thank you!

## 2014-05-17 NOTE — Telephone Encounter (Signed)
Return call to [place]. Advised that MD did order two injections at onset of therapy but first inject was just given in 04-18-14 so it is early to begin planning for next shipment. Per Fara Olden, they are placing call on behalf of patient who has some questions about therapy. Again advised joel that although two injections were originally ordered, it is too early to tell if patient will elect to continue therapy and we will follow up with patient directly.  Routing to provider for final review. Patient agreeable to disposition. Will close encounter

## 2014-05-17 NOTE — Telephone Encounter (Signed)
See previous phone note. Pharmacy said patient had questions about Lupron therapy. Call to patient. She states [facility] started calling her two weeks ago about dispensing her next injection.  Patient only said to call office because she was surprised they were trying to ship so soon and once it is shipped she has to pay for it even if not used. She is still having issues with bill between [facility] and [facility] from 1st injection. Advised it is currently our plan to continue with second injection for a total of 6 months of Lupron if she is tolerating it well. Patient states she is currently doing well. Has had one cycle, a little earlier and lighter than normal. Aware this is to be expected. States that she is having a few hot flashes but it is tolerable. Some increased irritability but it is manageable. Advised will provide Dr Quincy Simmonds with update and see if anything is needed before next injection due approximately 07-18-14.

## 2014-06-15 ENCOUNTER — Telehealth: Payer: Self-pay | Admitting: *Deleted

## 2014-06-15 DIAGNOSIS — D259 Leiomyoma of uterus, unspecified: Secondary | ICD-10-CM

## 2014-06-15 NOTE — Telephone Encounter (Signed)
See next phone encounter. Encounter closed.   

## 2014-06-15 NOTE — Telephone Encounter (Signed)
See previous phone encounter. Per Dr Quincy Simmonds, recommend proceed with second Lupron injection in and then follow up [place] in December to finalize plan for surgery. Call to patient, LMTCB.

## 2014-06-16 NOTE — Telephone Encounter (Signed)
Return call. Discussed Dr Elza Rafter recommendation to proceed with next Lupron injection and then have follow-up ultrasound and consult. Patient agreeable to plan, states tolerating Lupron better than she expected.  Would like to plan surgery more toward end of February or early March due to some health issues of her parents that she is dealing with right now. Advised with Lupron injection being due 07-20-14 (based on first injection 04-20-14), this will be continuing to work for 12 weeks from injection, approx first week of March. Scheduled appointment for Lupron injection for 07-22-14 at 10am. Patient will contact mail order pharmacy and approve shipment of injection. PUS and consult with Dr Quincy Simmonds scheduled for 08-18-14.  Will plan surgery after consult with Dr Quincy Simmonds.  Agree with above?

## 2014-06-17 NOTE — Telephone Encounter (Signed)
Encounter closed

## 2014-06-17 NOTE — Telephone Encounter (Signed)
I agree with the plan above. Thank you.

## 2014-06-21 NOTE — Addendum Note (Signed)
Addended by: Michele Mcalpine on: 06/21/2014 02:01 PM   Modules accepted: Orders

## 2014-07-22 ENCOUNTER — Ambulatory Visit (INDEPENDENT_AMBULATORY_CARE_PROVIDER_SITE_OTHER): Payer: BC Managed Care – PPO | Admitting: *Deleted

## 2014-07-22 VITALS — BP 134/80 | HR 86 | Resp 16 | Ht 66.0 in | Wt 209.0 lb

## 2014-07-22 DIAGNOSIS — D259 Leiomyoma of uterus, unspecified: Secondary | ICD-10-CM

## 2014-07-22 MED ORDER — LEUPROLIDE ACETATE (3 MONTH) 11.25 MG IM KIT
11.2500 mg | PACK | Freq: Once | INTRAMUSCULAR | Status: AC
  Administered 2014-07-22: 11.25 mg via INTRAMUSCULAR

## 2014-07-22 NOTE — Progress Notes (Signed)
Patient is here for Lupron Injection   LMP: 06/30/14  Given in Right upper outer quandrant  Patient tolerated Injection well.  Routed to provider for review, encounter closed. CC: Lamont Snowball, RN

## 2014-07-24 NOTE — Progress Notes (Signed)
Encounter reviewed by Dr. Brook Silva.  

## 2014-08-15 ENCOUNTER — Telehealth: Payer: Self-pay | Admitting: Obstetrics & Gynecology

## 2014-08-15 ENCOUNTER — Other Ambulatory Visit: Payer: Self-pay

## 2014-08-15 DIAGNOSIS — Z1231 Encounter for screening mammogram for malignant neoplasm of breast: Secondary | ICD-10-CM

## 2014-08-15 NOTE — Telephone Encounter (Signed)
Pt called to cancel her u/s and appointment for this Thursday. States her father is ill and she is headed out of town to be with him.  Pt agreeable to call for u/s reschedule.  bf

## 2014-08-17 NOTE — Telephone Encounter (Signed)
Left message to call Karsynn Deweese at [phone]. 

## 2014-08-18 ENCOUNTER — Other Ambulatory Visit: Payer: BC Managed Care – PPO

## 2014-08-18 ENCOUNTER — Other Ambulatory Visit: Payer: BC Managed Care – PPO | Admitting: Obstetrics and Gynecology

## 2014-08-22 NOTE — Telephone Encounter (Signed)
Spoke with patient. Advised that per benefit quote received, she will be responsible for $20 copay when she comes in for PUS. Patient agreeable. Scheduled PUS. Advised patient of 72 hour cancellation policy and $051 cancellation fee. Patient agreeable.

## 2014-08-26 ENCOUNTER — Ambulatory Visit: Payer: BC Managed Care – PPO

## 2014-09-02 ENCOUNTER — Ambulatory Visit: Payer: Self-pay

## 2014-09-09 ENCOUNTER — Ambulatory Visit
Admission: RE | Admit: 2014-09-09 | Discharge: 2014-09-09 | Disposition: A | Discharge status: Home / Self Care | Payer: BLUE CROSS/BLUE SHIELD | Source: Ambulatory Visit

## 2014-09-09 DIAGNOSIS — Z1231 Encounter for screening mammogram for malignant neoplasm of breast: Secondary | ICD-10-CM

## 2014-09-13 ENCOUNTER — Telehealth: Payer: Self-pay | Admitting: *Deleted

## 2014-09-13 NOTE — Telephone Encounter (Signed)
Faxed fax back to [facility] in regards to Dr. Elza Rafter recommendation.

## 2014-09-13 NOTE — Telephone Encounter (Signed)
Medication refill request: Lupron Depo 11.25 mg  Last AEX: 02/21/14 with Dr. Quincy Simmonds Next AEX: 03/17/15 with Dr. Quincy Simmonds Last MMG (if hormonal medication request): 09/12/14 Digital Screening Bilateral MMG Bi-Rads 1: Negative Refill authorized: Please advise.

## 2014-09-13 NOTE — Telephone Encounter (Signed)
Refill of Depo Lupron not appropriate at this time.  Patient has an appointment with me this week.

## 2014-09-13 NOTE — Telephone Encounter (Signed)
Encounter closed by me. 

## 2014-09-15 ENCOUNTER — Ambulatory Visit (INDEPENDENT_AMBULATORY_CARE_PROVIDER_SITE_OTHER): Payer: BLUE CROSS/BLUE SHIELD | Admitting: Obstetrics and Gynecology

## 2014-09-15 ENCOUNTER — Ambulatory Visit (INDEPENDENT_AMBULATORY_CARE_PROVIDER_SITE_OTHER): Payer: BLUE CROSS/BLUE SHIELD

## 2014-09-15 ENCOUNTER — Encounter: Payer: Self-pay | Admitting: Obstetrics and Gynecology

## 2014-09-15 VITALS — BP 140/96 | HR 76 | Ht 66.0 in | Wt 216.0 lb

## 2014-09-15 DIAGNOSIS — N8320 Unspecified ovarian cysts: Secondary | ICD-10-CM

## 2014-09-15 DIAGNOSIS — D259 Leiomyoma of uterus, unspecified: Secondary | ICD-10-CM

## 2014-09-15 DIAGNOSIS — N83202 Unspecified ovarian cyst, left side: Secondary | ICD-10-CM

## 2014-09-15 NOTE — Progress Notes (Signed)
Subjective  Patient is here for pelvic ultrasound for fibroid check and hysterectomy planning. Has received Depo Lupron x 2.  Has had some spotting with both injections.  Feels that her uterus is smaller.   Considering hysterectomy for April or May 2016.  Objective  Abdomen - soft, nontender, 17 - 18 week size uterus.  Pelvic ultrasound and images reviewed with patient and compared to previous ultrasounds. Uterus with 5 fibroids - 9 mm - 77 mm, intramural and subserosal.  Endometrium distorted and with no masses.  EMS not measured. Right ovary normal.  Left ovary with 28 mm cyst and internal septations c/w possible hemorrhagic cyst.  Normal blood supply, smooth thin walls.  No free fluid.     Assessment  Multifibroid uterus.  Status post Depo Lupron x 2 with some reduction in overall uterine size.  New multiseptate left ovarian cyst.   Plan  Discussion of fibroids including rates of sarcomatous degeneration of fibroids.  Discussion of ovarian cysts.  Will check CA125 - explained exam to patient.  Discussion of abdominal hysterectomy, bilateral salpingectomy, and possible left oophorectomy.    Discussed possible repeat pelvic ultrasound to re-evaluate left ovary prior to surgery.  Discussed potential for additional Depo Lupron for one or two more months depending on the surgical date.  25 minutes face to face time of which over 50% was spent in counseling.   After visit summary to patient.

## 2014-09-15 NOTE — Patient Instructions (Addendum)
Abdominal Hysterectomy Abdominal hysterectomy is a surgical procedure to remove your womb (uterus). Your uterus is the muscular organ that contains a developing baby. This surgery is done for many reasons. You may need an abdominal hysterectomy if you have cancer, growths (tumors), long-term pain, or bleeding. You may also have this procedure if your uterus has slipped down into your vagina (uterine prolapse). Depending on why you need an abdominal hysterectomy, you may also have other reproductive organs removed. These could include the part of your vagina that connects with your uterus (cervix), the organs that make eggs (ovaries), and the tubes that connect the ovaries to the uterus (fallopian tubes). LET [facility] CARE PROVIDER KNOW ABOUT:   Any allergies you have.  All medicines you are taking, including vitamins, herbs, eye drops, creams, and over-the-counter medicines.  Previous problems you or members of your family have had with the use of anesthetics.  Any blood disorders you have.  Previous surgeries you have had.  Medical conditions you have. RISKS AND COMPLICATIONS Generally, this is a safe procedure. However, as with any procedure, problems can occur. Infection is the most common problem after an abdominal hysterectomy. Other possible problems include:  Bleeding.  Formation of blood clots that may break free and travel to your lungs.  Injury to other organs near your uterus.  Nerve injury causing nerve pain.  Decreased interest in sex or pain during sexual intercourse. BEFORE THE PROCEDURE  Abdominal hysterectomy is a major surgical procedure. It can affect the way you feel about yourself. Talk to your health care provider about the physical and emotional changes hysterectomy may cause.  You may need to have blood work and X-rays done before surgery.  Quit smoking if you smoke. Ask your health care provider for help if you are struggling to quit.  Stop taking  medicines that thin your blood as directed by your health care provider.  You may be instructed to take antibiotic medicines or laxatives before surgery.  Do not eat or drink anything for 6-8 hours before surgery.  Take your regular medicines with a small sip of water.  Bathe or shower the night or morning before surgery. PROCEDURE  Abdominal hysterectomy is done in the operating room at the hospital.  In most cases, you will be given a medicine that makes you go to sleep (general anesthetic).  The surgeon will make a cut (incision) through the skin in your lower belly.  The incision may be about 5-7 inches long. It may go side-to-side or up-and-down.  The surgeon will move aside the body tissue that covers your uterus. The surgeon will then carefully take out your uterus along with any of your other reproductive organs that need to be removed.  Bleeding will be controlled with clamps or sutures.  The surgeon will close your incision with sutures or metal clips. AFTER THE PROCEDURE  You will have some pain immediately after the procedure.  You will be given pain medicine in the recovery room.  You will be taken to your hospital room when you have recovered from the anesthesia.  You may need to stay in the hospital for 2-5 days.  You will be given instructions for recovery at home. Document Released: 08/03/2013 Document Reviewed: 08/03/2013 [facility] Patient Information 2015 [place], [place]. This information is not intended to replace advice given to you by your health care provider. Make sure you discuss any questions you have with your health care provider.  Ovarian Cyst An ovarian cyst is  a fluid-filled sac that forms on an ovary. The ovaries are small organs that produce eggs in women. Various types of cysts can form on the ovaries. Most are not cancerous. Many do not cause problems, and they often go away on their own. Some may cause symptoms and require treatment. Common  types of ovarian cysts include:  Functional cysts--These cysts may occur every month during the menstrual cycle. This is normal. The cysts usually go away with the next menstrual cycle if the woman does not get pregnant. Usually, there are no symptoms with a functional cyst.  Endometrioma cysts--These cysts form from the tissue that lines the uterus. They are also called "chocolate cysts" because they become filled with blood that turns brown. This type of cyst can cause pain in the lower abdomen during intercourse and with your menstrual period.  Cystadenoma cysts--This type develops from the cells on the outside of the ovary. These cysts can get very big and cause lower abdomen pain and pain with intercourse. This type of cyst can twist on itself, cut off its blood supply, and cause severe pain. It can also easily rupture and cause a lot of pain.  Dermoid cysts--This type of cyst is sometimes found in both ovaries. These cysts may contain different kinds of body tissue, such as skin, teeth, hair, or cartilage. They usually do not cause symptoms unless they get very big.  Theca lutein cysts--These cysts occur when too much of a certain hormone (human chorionic gonadotropin) is produced and overstimulates the ovaries to produce an egg. This is most common after procedures used to assist with the conception of a baby (in vitro fertilization). CAUSES   Fertility drugs can cause a condition in which multiple large cysts are formed on the ovaries. This is called ovarian hyperstimulation syndrome.  A condition called polycystic ovary syndrome can cause hormonal imbalances that can lead to nonfunctional ovarian cysts. SIGNS AND SYMPTOMS  Many ovarian cysts do not cause symptoms. If symptoms are present, they may include:  Pelvic pain or pressure.  Pain in the lower abdomen.  Pain during sexual intercourse.  Increasing girth (swelling) of the abdomen.  Abnormal menstrual periods.  Increasing  pain with menstrual periods.  Stopping having menstrual periods without being pregnant. DIAGNOSIS  These cysts are commonly found during a routine or annual pelvic exam. Tests may be ordered to find out more about the cyst. These tests may include:  Ultrasound.  X-ray of the pelvis.  CT scan.  MRI.  Blood tests. TREATMENT  Many ovarian cysts go away on their own without treatment. Your health care provider may want to check your cyst regularly for 2-3 months to see if it changes. For women in menopause, it is particularly important to monitor a cyst closely because of the higher rate of ovarian cancer in menopausal women. When treatment is needed, it may include any of the following:  A procedure to drain the cyst (aspiration). This may be done using a long needle and ultrasound. It can also be done through a laparoscopic procedure. This involves using a thin, lighted tube with a tiny camera on the end (laparoscope) inserted through a small incision.  Surgery to remove the whole cyst. This may be done using laparoscopic surgery or an open surgery involving a larger incision in the lower abdomen.  Hormone treatment or birth control pills. These methods are sometimes used to help dissolve a cyst. HOME CARE INSTRUCTIONS   Only take over-the-counter or prescription medicines as directed  by your health care provider.  Follow up with your health care provider as directed.  Get regular pelvic exams and Pap tests. SEEK MEDICAL CARE IF:   Your periods are late, irregular, or painful, or they stop.  Your pelvic pain or abdominal pain does not go away.  Your abdomen becomes larger or swollen.  You have pressure on your bladder or trouble emptying your bladder completely.  You have pain during sexual intercourse.  You have feelings of fullness, pressure, or discomfort in your stomach.  You lose weight for no apparent reason.  You feel generally ill.  You become constipated.  You  lose your appetite.  You develop acne.  You have an increase in body and facial hair.  You are gaining weight, without changing your exercise and eating habits.  You think you are pregnant. SEEK IMMEDIATE MEDICAL CARE IF:   You have increasing abdominal pain.  You feel sick to your stomach (nauseous), and you throw up (vomit).  You develop a fever that comes on suddenly.  You have abdominal pain during a bowel movement.  Your menstrual periods become heavier than usual. MAKE SURE YOU:  Understand these instructions.  Will watch your condition.  Will get help right away if you are not doing well or get worse. Document Released: 07/29/2005 Document Revised: 08/03/2013 Document Reviewed: 04/05/2013 [facility] Patient Information 2015 [place], [place]. This information is not intended to replace advice given to you by your health care provider. Make sure you discuss any questions you have with your health care provider.

## 2014-09-16 ENCOUNTER — Encounter: Payer: Self-pay | Admitting: Obstetrics and Gynecology

## 2014-09-16 LAB — CA 125: CA 125: 6 U/mL (ref <35)

## 2014-09-19 ENCOUNTER — Telehealth: Payer: Self-pay | Admitting: Obstetrics and Gynecology

## 2014-09-19 NOTE — Telephone Encounter (Signed)
Left message for patient to call back. Need to go over benefits for surgery.

## 2014-09-20 NOTE — Telephone Encounter (Signed)
Spoke with patient. Advised that per benefit quote received, she will be responsible for $280.42 for the surgeon portion of her surgery. Advised that payment is due in full at least 3 weeks prior to the scheduled surgery date. Patient agreeable. Advised patient that once she is scheduled, she will receive separate communication from the hospital billing dept regarding facility fee/payment. Patient agreeable.  Advised that she will be hearing from [place] regarding scheduling.

## 2014-09-22 NOTE — Telephone Encounter (Signed)
Call to patient to check on preference for surgery dates. Left message to call back.

## 2014-09-29 NOTE — Telephone Encounter (Signed)
Call to patient, left message to call back regarding planning and date preferences.   Need update on date preference for surgery as soon as possible so next Lupron can be ordered if needed.

## 2014-09-30 NOTE — Telephone Encounter (Signed)
Return call from patient. Reports father has continued to be ill and [place]. Due to has extended illness she is unable to make plans for surgery at this time. She may need to delay surgery until July or August 2016 and needs to know if she can/should continue Lupron until then. Last Lupron injection on 07/22/2014 was 11.25 mg. Next does needed by 10-21-14. Patient aware and states pharmacy has called her. Patient states it will be at least 1-2 weeks before she knows if father is stable enough that she can proceed with scheduling her own procedure. Advised Dr Quincy Simmonds will review and we will call her back with Dr Elza Rafter instructions.

## 2014-10-02 NOTE — Telephone Encounter (Signed)
I would recommend proceeding with a one month only injection of the Depo Lupron.  I recommend doing a follow up pelvic ultrasound the beginning of April, whether we proceed forward or not with surgery this spring.   She developed an ovarian cyst that I was not expecting while she has been on the Depo Lupron.  Thank you.

## 2014-10-06 NOTE — Telephone Encounter (Signed)
Call to patient. Per previous instructions from patient, leave message and she will call back when able. Left message with Dr Elza Rafter recommendation and to call back when able and let me know how she would like to proceed.

## 2014-10-10 NOTE — Telephone Encounter (Signed)
Returning a call to [place]. Patient is aware Gay Filler is out of the office today.

## 2014-10-12 NOTE — Telephone Encounter (Signed)
Patient calling with possible surgery dates in April or May. Also wants to schedule Lupron.

## 2014-10-13 MED ORDER — LEUPROLIDE ACETATE 3.75 MG IM KIT: 3.7500 mg | PACK | Freq: Once | INTRAMUSCULAR | Status: DC

## 2014-10-13 NOTE — Telephone Encounter (Signed)
Return call to patient. She is ready to proceed with scheduling surgery is April or may. Available dates discussed and compared with her schedule. She is leaning toward 11-22-14. Discussed one month dose of Lupron to get to this date. She will confirm with family and then call back. Will proceed with ordering Lupron.

## 2014-10-13 NOTE — Telephone Encounter (Signed)
Call to [facility] to reorder Lupron. Previous case ID [id number] for 11.25 dose is approved thru 01-2015 per [place]. Advised need to re-order but need to change dose to 3.75 mg. Per Sheliah Mends, need case started, case ID [id number] approved through 04-11-15.  Must order with [facility] [phone].

## 2014-10-13 NOTE — Telephone Encounter (Signed)
Call to [facility], Lorriane Shire, Lupron RX called in for Lupron 3.75 mg IM x1 dose with no refills. They will contact patient to confirm and ship to office. Advised next dose need to arrive on 10-20-14.  Routing to provider for final review. Patient agreeable to disposition. Will close encounter

## 2014-10-25 ENCOUNTER — Telehealth: Payer: Self-pay | Admitting: Obstetrics and Gynecology

## 2014-10-25 DIAGNOSIS — N83202 Unspecified ovarian cyst, left side: Secondary | ICD-10-CM

## 2014-10-25 NOTE — Telephone Encounter (Signed)
Message sent to triage Samantha Schmitt out of the office today.

## 2014-10-25 NOTE — Telephone Encounter (Addendum)
Call to patient. She would like to further discuss surgical dates with Lamont Snowball, RN. Advised Gay Filler is not in the office today but could relay messages.   Patient has decided that 11/22/14 will not work for family schedule. However, is requesting to see if 12/06/14 would be available.   Patient would also like to schedule Lupron injection. Last injection 07/22/14 11.25 mg. Not currently having cycle. Scheduled Lupron injection for 10/28/14 date requested by patient.

## 2014-10-25 NOTE — Telephone Encounter (Signed)
Patient is returning a call to [place]. She will be in a meeting until 11am.

## 2014-10-27 NOTE — Telephone Encounter (Signed)
Call to patient. Advised of Dr Elza Rafter recommendation to proceed with injection as scheduled. Patient agreeable. Discussed surgery date options. Advised 12-06-14 is not available. She is now interested in 12-13-14 which is available.  Patient advised that additional Lupron injection may be recommended depending on date selected. Will review with Dr Quincy Simmonds once she selects date.  Patient will discuss with family and notify me when decision made.  Routing to provider for final review. Patient agreeable to disposition. Will close encounter

## 2014-10-27 NOTE — Telephone Encounter (Signed)
Spoke with patient. Advised of benefit quote received for PUS. Patient agreeable. Scheduled PUS.  Advised patient of 72 hour cancellation policy and $374 cancellation fee. Patient agreeable.   Patient would like to know if she can have her shot in the same visit with PUS. Gay Filler is to check with Dr Quincy Simmonds and notify patient.

## 2014-10-27 NOTE — Telephone Encounter (Signed)
Dr Quincy Simmonds. Last Lupron injection 07-22-14. Patient scheduled for PUS on 11-03-14 and would like to wait and get Lupron injection at that time. This would be 15 weeks from last injection. Please advise.

## 2014-10-27 NOTE — Telephone Encounter (Signed)
I do not recommend doing the Depo Lupron injection late.  We are trying to reassess her ovarian cyst.  If we do the injection late, this makes it a bit harder to evaluate the ovaries, because we could see cysts develop due to the Depo Lupron wearing off and those ovaries waking up!!!

## 2014-10-28 ENCOUNTER — Ambulatory Visit (INDEPENDENT_AMBULATORY_CARE_PROVIDER_SITE_OTHER): Payer: BLUE CROSS/BLUE SHIELD | Admitting: *Deleted

## 2014-10-28 VITALS — BP 110/84 | HR 82 | Ht 66.0 in | Wt 223.6 lb

## 2014-10-28 DIAGNOSIS — D259 Leiomyoma of uterus, unspecified: Secondary | ICD-10-CM

## 2014-10-28 MED ORDER — LEUPROLIDE ACETATE 3.75 MG IM KIT
3.7500 mg | PACK | Freq: Once | INTRAMUSCULAR | Status: AC
  Administered 2014-10-28: 3.75 mg via INTRAMUSCULAR

## 2014-10-28 NOTE — Progress Notes (Signed)
Patient states she has decided on 12-13-14 surgery date. She is scheduled for follow-up pelvic ultrasound to recheck ovarian cyst on 11-03-14. Advise will review surgery date with Dr Quincy Simmonds to determine if any adjustments in ultrasound schedule are needed. Also need to know if patient will need additional Lupron injection since surgery is 6.5 weeks away. Please advise.

## 2014-10-28 NOTE — Progress Notes (Signed)
Patient is here for Lupron Injection LMP: 08/18/2014 Patient tolerated injection well in Left Ventrogluteal Muscle Was then taken to talk to Gay Filler, RN  Routed to provider for review, encounter closed. CC: Gay Filler, RN

## 2014-11-03 ENCOUNTER — Ambulatory Visit (INDEPENDENT_AMBULATORY_CARE_PROVIDER_SITE_OTHER): Payer: BLUE CROSS/BLUE SHIELD

## 2014-11-03 ENCOUNTER — Encounter: Payer: Self-pay | Admitting: Obstetrics and Gynecology

## 2014-11-03 ENCOUNTER — Ambulatory Visit (INDEPENDENT_AMBULATORY_CARE_PROVIDER_SITE_OTHER): Payer: BLUE CROSS/BLUE SHIELD | Admitting: Obstetrics and Gynecology

## 2014-11-03 VITALS — BP 136/86 | HR 66 | Ht 66.0 in | Wt 220.0 lb

## 2014-11-03 DIAGNOSIS — N83202 Unspecified ovarian cyst, left side: Secondary | ICD-10-CM

## 2014-11-03 DIAGNOSIS — D259 Leiomyoma of uterus, unspecified: Secondary | ICD-10-CM

## 2014-11-03 DIAGNOSIS — N8320 Unspecified ovarian cysts: Secondary | ICD-10-CM | POA: Diagnosis not present

## 2014-11-03 NOTE — Progress Notes (Signed)
Encounter reviewed by Dr. Brook Silva.  

## 2014-11-03 NOTE — Progress Notes (Signed)
Subjective   Patient presents for pelvic ultrasound to recheck left ovarian cyst.  Has known large uterine fibroids and is currently on Depo Lupron for uterine shrinkage.  Menses have not been significantly heavy or causing anemia for patient.  Cyst formed while on Depo Lupron.  CA125 6 on 09/15/14.  Objective   Ultrasound showing multifibroid uterus.  No change in size.  Left ovarian cyst 2.8 cm, thick walled with 2 mm septations.  No abnormal blood flow.  No change in size from previous. No free fluid.     Assessment  Multifibroid uterus.  Stable complex left ovarian cyst.  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 Depo Lupron patient.   Plan  Patient is scheduled for a total abdominal hysterectomy with bilateral salpingectomy.  Will plan for ovarian cystectomy or more likely a left oophorectomy and collection of pelvic washings.  No need for further Depo Lupron.  Results to be communicated to patient after her visit as she did not have consultation on the same day.   After visit summary to patient.

## 2014-11-07 ENCOUNTER — Telehealth: Payer: Self-pay | Admitting: Obstetrics and Gynecology

## 2014-11-07 NOTE — Telephone Encounter (Signed)
Phone call to discuss pelvic ultrasound results.   Discussed findings of stable fibroids and persistent left ovarian cyst.   I discussed left ovarian cystectomy versus oophorectomy at the time of her hysterectomy in May, 2016.   Patient will not need another Depo Lupron.   She will call to schedule a pre-op visit.

## 2014-11-07 NOTE — Telephone Encounter (Signed)
Phone call to discuss results from ultrasound. No details left on her cell phone.   LM that I called.  I left my cell number for the patient to call me back.   Ultrasound is unchanged from previous.  Fibroids are unchanged and right ovarian cyst are unchanged.   May consider cyst removal or oophorectomy at the time of hysterectomy.

## 2014-11-08 ENCOUNTER — Encounter: Payer: Self-pay | Admitting: *Deleted

## 2014-11-08 ENCOUNTER — Encounter: Payer: Self-pay | Admitting: Obstetrics and Gynecology

## 2014-11-08 ENCOUNTER — Telehealth: Payer: Self-pay | Admitting: *Deleted

## 2014-11-08 NOTE — Telephone Encounter (Signed)
I agree with the letter and can sign it tomorrow upon my return to the office.   Thank you!

## 2014-11-08 NOTE — Telephone Encounter (Signed)
Letter to your office for review. Can be mailed with surgery instruction sheet if letter is acceptable.

## 2014-11-08 NOTE — Telephone Encounter (Signed)
Call to patient. Confirmed sugrery scheduled for 12-13-14 at 68 at [facility]. Surgery instruction sheet reviewed and printed copy mailed. (see scanned copy.)   Patient requesting letter for employer stating date of surgery and recommended recovery period.

## 2014-11-14 NOTE — Telephone Encounter (Signed)
Surgical instruction sheet and letter for employer mailed to patient. Encounter closed.

## 2014-11-18 ENCOUNTER — Ambulatory Visit (INDEPENDENT_AMBULATORY_CARE_PROVIDER_SITE_OTHER): Payer: BLUE CROSS/BLUE SHIELD | Admitting: Obstetrics and Gynecology

## 2014-11-18 ENCOUNTER — Encounter: Payer: Self-pay | Admitting: Obstetrics and Gynecology

## 2014-11-18 VITALS — BP 130/84 | HR 68 | Ht 66.0 in | Wt 218.6 lb

## 2014-11-18 DIAGNOSIS — N8320 Unspecified ovarian cysts: Secondary | ICD-10-CM | POA: Diagnosis not present

## 2014-11-18 DIAGNOSIS — N83202 Unspecified ovarian cyst, left side: Secondary | ICD-10-CM

## 2014-11-18 DIAGNOSIS — D259 Leiomyoma of uterus, unspecified: Secondary | ICD-10-CM

## 2014-11-18 NOTE — Progress Notes (Signed)
Patient ID: Samantha Schmitt, female   DOB: 16-Mar-1971, 44 y.o.   MRN: [id number] GYNECOLOGY  VISIT   HPI: 44 y.o.   Single  Caucasian  female   G0P0 with Patient's last menstrual period was 08/18/2014.   here to discuss upcoming surgery.   Has enlarged uterus due to fibroids and has a complex left ovarian cyst which developed while taking Depo Lupron.   Last ultrasound done 11/03/14: Multifibroid uterus with 9 fibroids ranging from 1.3 - 6.9 cm. No change in size.  Left ovarian cyst 2.8 cm, thick walled with 2 mm septations. No abnormal blood flow. No change in size from previous ultrasound.  No free fluid.   CA125 6 on 09/15/14.  Has been on Depo Lupron since September 2015 for fibroids. No menstrual problems with heavy bleeding or pain.  No significant hot flashes.   Declines future childbearing.   GYNECOLOGIC HISTORY: Patient's last menstrual period was 08/18/2014. Contraception: abstinence Menopausal hormone therapy: n/a Pap 09/18/11 - WNL and negative HR HPV.  Mammogram 09/12/14 - WNL.   OB History    Gravida Para Term Preterm AB TAB SAB Ectopic Multiple Living   0                  Patient Active Problem List   Diagnosis Date Noted  . Fibroids 09/23/2013  . 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 Foot pain 06/03/2012  . Pronation of feet 06/03/2012  . Gait abnormality 06/03/2012  . Swelling of foot joint 06/03/2012    History reviewed. No pertinent past medical history.  Past Surgical History  Procedure Laterality Date  . Wisdom tooth extraction      Current Outpatient Prescriptions  Medication Sig Dispense Refill  . leuprolide (LUPRON DEPOT) 3.75 MG injection Inject 3.75 mg into the muscle once. 1 each 0  . Methylsulfonylmethane (MSM PO) Take by mouth daily.    . Multiple Vitamin (MULTIVITAMIN) capsule Take 1 capsule by mouth daily.    . Omega-3 Fatty Acids (FISH OIL) 1000 MG CAPS Take by mouth. Take one every other day    . vitamin E 200 UNIT capsule Take  200 Units by mouth daily.     No current facility-administered medications for this visit.     ALLERGIES: Minocycline  Family History  Problem Relation Age of Onset  . Hypertension Mother   . Thyroid disease Mother     History   Social History  . Marital Status: Single    Spouse Name: N/A  . Number of Children: N/A  . Years of Education: N/A   Occupational History  . Not on file.   Social History Main Topics  . Smoking status: Never Smoker   . Smokeless tobacco: Never Used  . Alcohol Use: No  . Drug Use: No  . Sexual Activity: No   Other Topics Concern  . Not on file   Social History Narrative    ROS:  Pertinent items are noted in HPI.  PHYSICAL EXAMINATION:    BP 130/84 mmHg  Pulse 68  Ht 5\' 6"  (1.676 m)  Wt 218 lb 9.6 oz (99.156 kg)  BMI 35.30 kg/m2  LMP 08/18/2014     General appearance: alert, cooperative and appears stated age Lungs: clear to auscultation bilaterally Heart: regular rate and rhythm Abdomen: soft, non-tender; no masses,  no organomegaly No abnormal inguinal nodes palpated  Pelvic: External genitalia:  no lesions              Urethra:  normal appearing urethra with no masses, tenderness or lesions              Bartholins and Skenes: normal                 Vagina: normal appearing vagina with normal color and discharge, no lesions              Cervix: normal appearance                   Bimanual Exam:  Uterus:  uterus is  18 week size and feels more vertical than horizontal, good mobility, nontender.                                      Adnexa: normal adnexa in size, nontender and no masses                                         ASSESSMENT  Uterine fibroids.  Persistent left ovarian cyst.  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 Status post Depo Lupron injections.   PLAN  Discussion of ovarian cysts Proceed with TAH/bilateral salpingectomy/left oophorectomy/collection of pelvic washings.   Discussed risks, benefits and  alternatives discussed with the patient who wishes to proceed.  Risks include but are not limited to bleeding, infection, damage to surrounding organs, reactions to anesthesia, pneumonia, DVT, PE, death, incisional hernias, and need for reoperation. Surgical expectations and recovery discussed. No further Depo Lupron needed.   An After Visit Summary was printed and given to the patient.  __25____ minutes face to face time of which over 50% was spent in counseling.

## 2014-12-08 ENCOUNTER — Telehealth: Payer: Self-pay | Admitting: Obstetrics and Gynecology

## 2014-12-08 NOTE — Patient Instructions (Addendum)
   Your procedure is scheduled on: Tuesday, May 3  Enter through the Main Entrance of [facility] at: 6 AM Pick up the phone at the desk and dial [phone] and inform us of your arrival.  Please call this number if you have any problems the morning of surgery: [phone]  Remember: Do not eat or drink after midnight: Monday Take these medicines the morning of surgery with a SIP OF WATER: NONE  Do not wear jewelry, make-up, or FINGER nail polish No metal in your hair or on your body. Do not wear lotions, powders, perfumes.  You may wear deodorant.  Do not bring valuables to the hospital. Contacts, dentures or bridgework may not be worn into surgery.  Leave suitcase in the car. After Surgery it may be brought to your room. For patients being admitted to the hospital, checkout time is 11:00am the day of discharge.  Home with brother Tray cell [phone]

## 2014-12-08 NOTE — Telephone Encounter (Signed)
Returned call to [place]. Advised that  Surgery has been approved.Josem Kaufmann # [id number]. Approved 2 days: 05.03.2016 and 05.04.2016 Will need hospital to call with discharge date [phone] opt 2

## 2014-12-08 NOTE — Telephone Encounter (Signed)
Samantha Schmitt from [place] call requesting to speak with the person who does our prior-authorizations. Routing to [place].

## 2014-12-09 ENCOUNTER — Encounter (HOSPITAL_COMMUNITY): Payer: Self-pay

## 2014-12-09 ENCOUNTER — Encounter (HOSPITAL_COMMUNITY)
Admission: RE | Admit: 2014-12-09 | Discharge: 2014-12-09 | Disposition: A | Discharge status: Home / Self Care | Payer: BLUE CROSS/BLUE SHIELD | Source: Ambulatory Visit | Attending: Obstetrics and Gynecology | Admitting: Obstetrics and Gynecology

## 2014-12-09 DIAGNOSIS — D259 Leiomyoma of uterus, unspecified: Secondary | ICD-10-CM | POA: Diagnosis not present

## 2014-12-09 DIAGNOSIS — Z01812 Encounter for preprocedural laboratory examination: Secondary | ICD-10-CM | POA: Insufficient documentation

## 2014-12-09 HISTORY — DX: Low back pain: M54.5

## 2014-12-09 HISTORY — DX: Low back pain, unspecified: M54.50

## 2014-12-09 LAB — CBC
HCT: 39.9 % (ref 36.0–46.0)
Hemoglobin: 14.1 g/dL (ref 12.0–15.0)
MCH: 29.9 pg (ref 26.0–34.0)
MCHC: 35.3 g/dL (ref 30.0–36.0)
MCV: 84.5 fL (ref 78.0–100.0)
Platelets: 223 10*3/uL (ref 150–400)
RBC: 4.72 MIL/uL (ref 3.87–5.11)
RDW: 12.5 % (ref 11.5–15.5)
WBC: 6.1 10*3/uL (ref 4.0–10.5)

## 2014-12-09 LAB — BASIC METABOLIC PANEL
Anion gap: 9 (ref 5–15)
BUN: 16 mg/dL (ref 6–23)
CALCIUM: 9.5 mg/dL (ref 8.4–10.5)
CO2: 27 mmol/L (ref 19–32)
Chloride: 104 mmol/L (ref 96–112)
Creatinine, Ser: 0.82 mg/dL (ref 0.50–1.10)
GFR calc non Af Amer: 86 mL/min — ABNORMAL LOW (ref >90)
GLUCOSE: 90 mg/dL (ref 70–99)
Potassium: 4.1 mmol/L (ref 3.5–5.1)
Sodium: 140 mmol/L (ref 135–145)

## 2014-12-09 NOTE — Pre-Procedure Instructions (Deleted)
Domenic Polite, RN is charting under Marga Melnick, RN on this patient.  Amy is a new PRN RN and is unable to sign into EPIC.  IT has a high priority ticket open to correct issue.

## 2014-12-12 MED ORDER — DEXTROSE 5 % IV SOLN
2.0000 g | INTRAVENOUS | Status: AC
  Administered 2014-12-13: 2 g via INTRAVENOUS
  Filled 2014-12-12: qty 2

## 2014-12-12 NOTE — Anesthesia Preprocedure Evaluation (Addendum)
Anesthesia Evaluation  Patient identified by MRN, date of birth, ID band Patient awake    Reviewed: Allergy & Precautions, NPO status , Patient's Chart, lab work & pertinent test results  History of Anesthesia Complications Negative for: history of anesthetic complications  Airway Mallampati: II  TM Distance: >3 FB Neck ROM: Full    Dental no notable dental hx. (+) Dental Advisory Given   Pulmonary neg pulmonary ROS,  breath sounds clear to auscultation  Pulmonary exam normal       Cardiovascular negative cardio ROS  Rhythm:Regular Rate:Normal     Neuro/Psych negative neurological ROS  negative psych ROS   GI/Hepatic negative GI ROS, Neg liver ROS,   Endo/Other  obesity  Renal/GU negative Renal ROS  negative genitourinary   Musculoskeletal negative musculoskeletal ROS (+) Hx of L foot intermittent numbness from pinched nerve   Abdominal   Peds negative pediatric ROS (+)  Hematology negative hematology ROS (+)   Anesthesia Other Findings   Reproductive/Obstetrics negative OB ROS                            Anesthesia Physical Anesthesia Plan  ASA: II  Anesthesia Plan: General   Post-op Pain Management:    Induction: Intravenous  Airway Management Planned: Oral ETT  Additional Equipment:   Intra-op Plan:   Post-operative Plan: Extubation in OR  Informed Consent: I have reviewed the patients History and Physical, chart, labs and discussed the procedure including the risks, benefits and alternatives for the proposed anesthesia with the patient or authorized representative who has indicated his/her understanding and acceptance.   Dental advisory given  Plan Discussed with: CRNA  Anesthesia Plan Comments:         Anesthesia Quick Evaluation

## 2014-12-12 NOTE — H&P (Signed)
Nunzio Cobbs, MD at 11/18/2014  9:41 AM       Status: Signed        Expand All Collapse All   Patient ID: Samantha Schmitt, female   DOB: [birth year], 44 y.o.   MRN: [id number] GYNECOLOGY  VISIT   HPI: 44 y.o.   Single  Caucasian  female    G0P0 with Patient's last menstrual period was 08/18/2014.   here to discuss upcoming surgery.    Has enlarged uterus due to fibroids and has a complex left ovarian cyst which developed while taking Depo Lupron.   Last ultrasound done 11/03/14: Multifibroid uterus with 9 fibroids ranging from 1.3 - 6.9 cm. No change in size.   Left ovarian cyst 2.8 cm, thick walled with 2 mm septations.  No abnormal blood flow.  No change in size from previous ultrasound.   No free fluid.    CA125 6 on 09/15/14.  Has been on Depo Lupron since September 2015 for fibroids. No menstrual problems with heavy bleeding or pain.  No significant hot flashes.   Declines future childbearing.   GYNECOLOGIC HISTORY: Patient's last menstrual period was 08/18/2014. Contraception: abstinence Menopausal hormone therapy: n/a Pap 09/18/11 - WNL and negative HR HPV.   Mammogram 09/12/14 - WNL.     OB History      Gravida  Para  Term  Preterm  AB  TAB  SAB  Ectopic  Multiple  Living     0                               Patient Active Problem List     Diagnosis  Date Noted   .  Fibroids  09/23/2013   .  Loss of transverse plantar arch  06/03/2012   .  Foot pain  06/03/2012   .  Pronation of feet  06/03/2012   .  Gait abnormality  06/03/2012   .  Swelling of foot joint  06/03/2012     History reviewed. No pertinent past medical history.    Past Surgical History   Procedure  Laterality  Date   .  Wisdom tooth extraction           Current Outpatient Prescriptions   Medication  Sig  Dispense  Refill   .  leuprolide (LUPRON DEPOT) 3.75 MG injection  Inject 3.75 mg into the muscle once.  1 each  0   .  Methylsulfonylmethane (MSM PO)  Take by mouth daily.       .  Multiple  Vitamin (MULTIVITAMIN) capsule  Take 1 capsule by mouth daily.       .  Omega-3 Fatty Acids (FISH OIL) 1000 MG CAPS  Take by mouth. Take one every other day       .  vitamin E 200 UNIT capsule  Take 200 Units by mouth daily.          No current facility-administered medications for this visit.      ALLERGIES: Minocycline    Family History   Problem  Relation  Age of Onset   .  Hypertension  Mother     .  Thyroid disease  Mother         History      Social History   .  Marital Status:  Single       Spouse Name:  N/A   .  Number of Children:  N/A   .  Years of Education:  N/A      Occupational History   .  Not on file.      Social History Main Topics   .  Smoking status:  Never Smoker    .  Smokeless tobacco:  Never Used   .  Alcohol Use:  No   .  Drug Use:  No   .  Sexual Activity:  No      Other Topics  Concern   .  Not on file      Social History Narrative     ROS:  Pertinent items are noted in HPI.  PHYSICAL EXAMINATION:    BP 130/84 mmHg  Pulse 68  Ht 5\' 6"  (1.676 m)  Wt 218 lb 9.6 oz (99.156 kg)  BMI 35.30 kg/m2  LMP 08/18/2014     General appearance: alert, cooperative and appears stated age Lungs: clear to auscultation bilaterally Heart: regular rate and rhythm Abdomen: soft, non-tender; no masses,  no organomegaly No abnormal inguinal nodes palpated  Pelvic: External genitalia:  no lesions              Urethra:  normal appearing urethra with no masses, tenderness or lesions              Bartholins and Skenes: normal                  Vagina: normal appearing vagina with normal color and discharge, no lesions              Cervix: normal appearance                    Bimanual Exam:  Uterus:  uterus is  18 week size and feels more vertical than horizontal, good mobility, nontender.                                      Adnexa: normal adnexa in size, nontender and no masses                                         ASSESSMENT  Uterine fibroids.    Persistent left ovarian cyst.  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 Status post Depo Lupron injections.   PLAN  Discussion of ovarian cysts Proceed with TAH/bilateral salpingectomy/left oophorectomy/collection of pelvic washings.   Discussed risks, benefits and alternatives discussed with the patient who wishes to proceed.   Risks include but are not limited to bleeding, infection, damage to surrounding organs, reactions to anesthesia, pneumonia, DVT, PE, death, incisional hernias, and need for reoperation. Surgical expectations and recovery discussed. No further Depo Lupron needed.    An After Visit Summary was printed and given to the patient.  __25____ minutes face to face time of which over 50% was spent in counseling.

## 2014-12-13 ENCOUNTER — Encounter (HOSPITAL_COMMUNITY): Payer: Self-pay | Admitting: *Deleted

## 2014-12-13 ENCOUNTER — Encounter (HOSPITAL_COMMUNITY)
Admission: RE | Disposition: A | Discharge status: Home / Self Care | Payer: Self-pay | Source: Ambulatory Visit | Attending: Obstetrics and Gynecology

## 2014-12-13 ENCOUNTER — Inpatient Hospital Stay (HOSPITAL_COMMUNITY)
Admission: RE | Admit: 2014-12-13 | Discharge: 2014-12-15 | DRG: 743 | Disposition: A | Discharge status: Home / Self Care | Payer: BLUE CROSS/BLUE SHIELD | Source: Ambulatory Visit | Attending: Obstetrics and Gynecology | Admitting: Obstetrics and Gynecology

## 2014-12-13 ENCOUNTER — Inpatient Hospital Stay (HOSPITAL_COMMUNITY): Payer: BLUE CROSS/BLUE SHIELD | Admitting: Anesthesiology

## 2014-12-13 DIAGNOSIS — R269 Unspecified abnormalities of gait and mobility: Secondary | ICD-10-CM | POA: Diagnosis present

## 2014-12-13 DIAGNOSIS — M216X2 Other acquired deformities of left foot: Secondary | ICD-10-CM | POA: Diagnosis present

## 2014-12-13 DIAGNOSIS — N840 Polyp of corpus uteri: Secondary | ICD-10-CM | POA: Diagnosis present

## 2014-12-13 DIAGNOSIS — M216X1 Other acquired deformities of right foot: Secondary | ICD-10-CM | POA: Diagnosis present

## 2014-12-13 DIAGNOSIS — Z90710 Acquired absence of both cervix and uterus: Secondary | ICD-10-CM | POA: Diagnosis present

## 2014-12-13 DIAGNOSIS — D271 Benign neoplasm of left ovary: Secondary | ICD-10-CM | POA: Diagnosis present

## 2014-12-13 DIAGNOSIS — Z79899 Other long term (current) drug therapy: Secondary | ICD-10-CM | POA: Diagnosis not present

## 2014-12-13 DIAGNOSIS — D259 Leiomyoma of uterus, unspecified: Principal | ICD-10-CM | POA: Diagnosis present

## 2014-12-13 DIAGNOSIS — D649 Anemia, unspecified: Secondary | ICD-10-CM | POA: Diagnosis not present

## 2014-12-13 DIAGNOSIS — N8320 Unspecified ovarian cysts: Secondary | ICD-10-CM

## 2014-12-13 HISTORY — PX: ABDOMINAL HYSTERECTOMY: SHX81

## 2014-12-13 HISTORY — PX: SALPINGOOPHORECTOMY: SHX82

## 2014-12-13 HISTORY — PX: UNILATERAL SALPINGECTOMY: SHX6160

## 2014-12-13 LAB — PREGNANCY, URINE: Preg Test, Ur: NEGATIVE

## 2014-12-13 SURGERY — HYSTERECTOMY, ABDOMINAL
Anesthesia: General | Laterality: Right

## 2014-12-13 MED ORDER — BUPIVACAINE HCL (PF) 0.25 % IJ SOLN
INTRAMUSCULAR | Status: AC
  Filled 2014-12-13: qty 10

## 2014-12-13 MED ORDER — ACETAMINOPHEN 10 MG/ML IV SOLN
1000.0000 mg | Freq: Once | INTRAVENOUS | Status: AC
  Administered 2014-12-13: 1000 mg via INTRAVENOUS
  Filled 2014-12-13: qty 100

## 2014-12-13 MED ORDER — NEOSTIGMINE METHYLSULFATE 10 MG/10ML IV SOLN
INTRAVENOUS | Status: DC | PRN
  Administered 2014-12-13 (×2): 1.5 mg via INTRAVENOUS

## 2014-12-13 MED ORDER — ROCURONIUM BROMIDE 100 MG/10ML IV SOLN
INTRAVENOUS | Status: AC
  Filled 2014-12-13: qty 1

## 2014-12-13 MED ORDER — KETOROLAC TROMETHAMINE 30 MG/ML IJ SOLN
INTRAMUSCULAR | Status: DC | PRN
  Administered 2014-12-13: 30 mg via INTRAVENOUS

## 2014-12-13 MED ORDER — BUPIVACAINE HCL (PF) 0.25 % IJ SOLN
INTRAMUSCULAR | Status: DC | PRN
  Administered 2014-12-13: 10 mL

## 2014-12-13 MED ORDER — ONDANSETRON HCL 4 MG/2ML IJ SOLN: 4.0000 mg | Freq: Once | INTRAMUSCULAR | Status: DC | PRN

## 2014-12-13 MED ORDER — FENTANYL CITRATE (PF) 100 MCG/2ML IJ SOLN
25.0000 ug | INTRAMUSCULAR | Status: DC | PRN
  Administered 2014-12-13: 50 ug via INTRAVENOUS

## 2014-12-13 MED ORDER — ONDANSETRON HCL 4 MG/2ML IJ SOLN: 4.0000 mg | Freq: Four times a day (QID) | INTRAMUSCULAR | Status: DC | PRN

## 2014-12-13 MED ORDER — ONDANSETRON HCL 4 MG/2ML IJ SOLN
INTRAMUSCULAR | Status: DC | PRN
  Administered 2014-12-13: 4 mg via INTRAVENOUS

## 2014-12-13 MED ORDER — LACTATED RINGERS IV SOLN: INTRAVENOUS | Status: DC

## 2014-12-13 MED ORDER — LACTATED RINGERS IV SOLN
INTRAVENOUS | Status: DC
  Administered 2014-12-13 (×3): via INTRAVENOUS

## 2014-12-13 MED ORDER — MIDAZOLAM HCL 2 MG/2ML IJ SOLN
INTRAMUSCULAR | Status: DC | PRN
  Administered 2014-12-13: 2 mg via INTRAVENOUS

## 2014-12-13 MED ORDER — LACTATED RINGERS IV SOLN
INTRAVENOUS | Status: DC
  Administered 2014-12-13 – 2014-12-14 (×4): via INTRAVENOUS

## 2014-12-13 MED ORDER — IBUPROFEN 600 MG PO TABS
600.0000 mg | ORAL_TABLET | Freq: Four times a day (QID) | ORAL | Status: DC | PRN
Start: 2014-12-13 — End: 2014-12-15
  Administered 2014-12-14 – 2014-12-15 (×4): 600 mg via ORAL
  Filled 2014-12-13 (×4): qty 1

## 2014-12-13 MED ORDER — HEPARIN SODIUM (PORCINE) 5000 UNIT/ML IJ SOLN
INTRAMUSCULAR | Status: AC
Start: 2014-12-13 — End: 2014-12-13
  Filled 2014-12-13: qty 1

## 2014-12-13 MED ORDER — HEPARIN SODIUM (PORCINE) 5000 UNIT/ML IJ SOLN
INTRAMUSCULAR | Status: DC | PRN
  Administered 2014-12-13: 5000 [IU]

## 2014-12-13 MED ORDER — PROPOFOL 10 MG/ML IV BOLUS
INTRAVENOUS | Status: AC
  Filled 2014-12-13: qty 20

## 2014-12-13 MED ORDER — KETOROLAC TROMETHAMINE 30 MG/ML IJ SOLN
INTRAMUSCULAR | Status: AC
  Filled 2014-12-13: qty 1

## 2014-12-13 MED ORDER — FENTANYL CITRATE (PF) 100 MCG/2ML IJ SOLN
INTRAMUSCULAR | Status: DC | PRN
  Administered 2014-12-13 (×2): 50 ug via INTRAVENOUS
  Administered 2014-12-13 (×2): 100 ug via INTRAVENOUS

## 2014-12-13 MED ORDER — HYDROMORPHONE 0.3 MG/ML IV SOLN
INTRAVENOUS | Status: DC
  Administered 2014-12-13: 12:00:00 via INTRAVENOUS
  Administered 2014-12-13: 10 mL via INTRAVENOUS
  Administered 2014-12-13: 2.59 mg via INTRAVENOUS
  Administered 2014-12-14: 0.999 mg via INTRAVENOUS
  Filled 2014-12-13: qty 25

## 2014-12-13 MED ORDER — LIDOCAINE HCL (CARDIAC) 20 MG/ML IV SOLN
INTRAVENOUS | Status: AC
  Filled 2014-12-13: qty 5

## 2014-12-13 MED ORDER — FENTANYL CITRATE (PF) 250 MCG/5ML IJ SOLN
INTRAMUSCULAR | Status: AC
  Filled 2014-12-13: qty 5

## 2014-12-13 MED ORDER — SUCCINYLCHOLINE CHLORIDE 20 MG/ML IJ SOLN
INTRAMUSCULAR | Status: DC | PRN
  Administered 2014-12-13: 140 mg via INTRAVENOUS

## 2014-12-13 MED ORDER — NEOSTIGMINE METHYLSULFATE 10 MG/10ML IV SOLN
INTRAVENOUS | Status: AC
  Filled 2014-12-13: qty 1

## 2014-12-13 MED ORDER — SUCCINYLCHOLINE CHLORIDE 20 MG/ML IJ SOLN
INTRAMUSCULAR | Status: AC
  Filled 2014-12-13: qty 10

## 2014-12-13 MED ORDER — SCOPOLAMINE 1 MG/3DAYS TD PT72
MEDICATED_PATCH | TRANSDERMAL | Status: AC
  Administered 2014-12-13: 1.5 mg via TRANSDERMAL
  Filled 2014-12-13: qty 1

## 2014-12-13 MED ORDER — DIPHENHYDRAMINE HCL 50 MG/ML IJ SOLN: 12.5000 mg | Freq: Four times a day (QID) | INTRAMUSCULAR | Status: DC | PRN

## 2014-12-13 MED ORDER — PROPOFOL 10 MG/ML IV BOLUS
INTRAVENOUS | Status: DC | PRN
  Administered 2014-12-13: 180 mg via INTRAVENOUS

## 2014-12-13 MED ORDER — SCOPOLAMINE 1 MG/3DAYS TD PT72
1.0000 | MEDICATED_PATCH | Freq: Once | TRANSDERMAL | Status: DC
  Administered 2014-12-13: 1.5 mg via TRANSDERMAL

## 2014-12-13 MED ORDER — ROCURONIUM BROMIDE 100 MG/10ML IV SOLN
INTRAVENOUS | Status: DC | PRN
  Administered 2014-12-13: 5 mg via INTRAVENOUS
  Administered 2014-12-13: 10 mg via INTRAVENOUS
  Administered 2014-12-13: 30 mg via INTRAVENOUS
  Administered 2014-12-13 (×2): 10 mg via INTRAVENOUS

## 2014-12-13 MED ORDER — OXYCODONE-ACETAMINOPHEN 5-325 MG PO TABS
1.0000 | ORAL_TABLET | ORAL | Status: DC | PRN
  Administered 2014-12-14 – 2014-12-15 (×5): 1 via ORAL
  Filled 2014-12-13 (×5): qty 1

## 2014-12-13 MED ORDER — DEXAMETHASONE SODIUM PHOSPHATE 10 MG/ML IJ SOLN
INTRAMUSCULAR | Status: DC | PRN
  Administered 2014-12-13: 4 mg via INTRAVENOUS

## 2014-12-13 MED ORDER — DIPHENHYDRAMINE HCL 12.5 MG/5ML PO ELIX: 12.5000 mg | ORAL_SOLUTION | Freq: Four times a day (QID) | ORAL | Status: DC | PRN

## 2014-12-13 MED ORDER — SODIUM CHLORIDE 0.9 % IJ SOLN: 9.0000 mL | INTRAMUSCULAR | Status: DC | PRN

## 2014-12-13 MED ORDER — FENTANYL CITRATE (PF) 100 MCG/2ML IJ SOLN
INTRAMUSCULAR | Status: AC
  Filled 2014-12-13: qty 2

## 2014-12-13 MED ORDER — BUPIVACAINE LIPOSOME 1.3 % IJ SUSP
20.0000 mL | Freq: Once | INTRAMUSCULAR | Status: AC
Start: 2014-12-13 — End: 2014-12-13
  Administered 2014-12-13: 20 mL
  Filled 2014-12-13: qty 20

## 2014-12-13 MED ORDER — MIDAZOLAM HCL 2 MG/2ML IJ SOLN
INTRAMUSCULAR | Status: AC
  Filled 2014-12-13: qty 2

## 2014-12-13 MED ORDER — GLYCOPYRROLATE 0.2 MG/ML IJ SOLN
INTRAMUSCULAR | Status: DC | PRN
  Administered 2014-12-13 (×2): 0.3 mg via INTRAVENOUS

## 2014-12-13 MED ORDER — LIDOCAINE HCL (CARDIAC) 20 MG/ML IV SOLN
INTRAVENOUS | Status: DC | PRN
  Administered 2014-12-13: 50 mg via INTRAVENOUS

## 2014-12-13 MED ORDER — KETOROLAC TROMETHAMINE 30 MG/ML IJ SOLN
30.0000 mg | Freq: Four times a day (QID) | INTRAMUSCULAR | Status: DC
  Administered 2014-12-13 – 2014-12-14 (×3): 30 mg via INTRAVENOUS
  Filled 2014-12-13 (×3): qty 1

## 2014-12-13 MED ORDER — SODIUM CHLORIDE 0.9 % IR SOLN
Status: DC | PRN
  Administered 2014-12-13: 3000 mL

## 2014-12-13 MED ORDER — ONDANSETRON HCL 4 MG PO TABS: 4.0000 mg | ORAL_TABLET | Freq: Four times a day (QID) | ORAL | Status: DC | PRN

## 2014-12-13 MED ORDER — NALOXONE HCL 0.4 MG/ML IJ SOLN: 0.4000 mg | INTRAMUSCULAR | Status: DC | PRN

## 2014-12-13 MED ORDER — ONDANSETRON HCL 4 MG/2ML IJ SOLN
INTRAMUSCULAR | Status: AC
  Filled 2014-12-13: qty 2

## 2014-12-13 MED ORDER — MENTHOL 3 MG MT LOZG: 1.0000 | LOZENGE | OROMUCOSAL | Status: DC | PRN

## 2014-12-13 MED ORDER — GLYCOPYRROLATE 0.2 MG/ML IJ SOLN
INTRAMUSCULAR | Status: AC
Start: 2014-12-13 — End: 2014-12-13
  Filled 2014-12-13: qty 3

## 2014-12-13 SURGICAL SUPPLY — 40 items
APL SKNCLS STERI-STRIP NONHPOA (GAUZE/BANDAGES/DRESSINGS) ×3
BENZOIN TINCTURE PRP APPL 2/3 (GAUZE/BANDAGES/DRESSINGS) ×2 IMPLANT
CANISTER SUCT 3000ML (MISCELLANEOUS) ×5 IMPLANT
CATH FOLEY 3WAY  5CC 16FR (CATHETERS)
CATH FOLEY 3WAY 5CC 16FR (CATHETERS) IMPLANT
CLOSURE WOUND 1/2 X4 (GAUZE/BANDAGES/DRESSINGS) ×1
CLOTH BEACON ORANGE TIMEOUT ST (SAFETY) ×5 IMPLANT
DECANTER SPIKE VIAL GLASS SM (MISCELLANEOUS) IMPLANT
DRAPE WARM FLUID 44X44 (DRAPE) IMPLANT
DRSG OPSITE POSTOP 4X10 (GAUZE/BANDAGES/DRESSINGS) ×5 IMPLANT
DURAPREP 26ML APPLICATOR (WOUND CARE) ×5 IMPLANT
GAUZE SPONGE 4X4 16PLY XRAY LF (GAUZE/BANDAGES/DRESSINGS) ×2 IMPLANT
GLOVE BIO SURGEON STRL SZ 6.5 (GLOVE) ×4 IMPLANT
GLOVE BIO SURGEONS STRL SZ 6.5 (GLOVE) ×1
GLOVE BIOGEL PI IND STRL 7.0 (GLOVE) ×6 IMPLANT
GLOVE BIOGEL PI INDICATOR 7.0 (GLOVE) ×6
GOWN STRL REUS W/TWL LRG LVL3 (GOWN DISPOSABLE) ×15 IMPLANT
NEEDLE HYPO 22GX1.5 SAFETY (NEEDLE) ×2 IMPLANT
NS IRRIG 1000ML POUR BTL (IV SOLUTION) ×5 IMPLANT
PACK ABDOMINAL GYN (CUSTOM PROCEDURE TRAY) ×5 IMPLANT
PAD OB MATERNITY 4.3X12.25 (PERSONAL CARE ITEMS) ×5 IMPLANT
PROTECTOR NERVE ULNAR (MISCELLANEOUS) ×5 IMPLANT
SET CYSTO W/LG BORE CLAMP LF (SET/KITS/TRAYS/PACK) IMPLANT
SHEET LAVH (DRAPES) ×5 IMPLANT
SPONGE LAP 18X18 X RAY DECT (DISPOSABLE) ×10 IMPLANT
STAPLER VISISTAT 35W (STAPLE) ×5 IMPLANT
STRIP CLOSURE SKIN 1/2X4 (GAUZE/BANDAGES/DRESSINGS) ×1 IMPLANT
SUT PLAIN 2 0 XLH (SUTURE) ×2 IMPLANT
SUT VIC AB 0 CT1 18XCR BRD8 (SUTURE) ×6 IMPLANT
SUT VIC AB 0 CT1 27 (SUTURE) ×15
SUT VIC AB 0 CT1 27XBRD ANBCTR (SUTURE) ×9 IMPLANT
SUT VIC AB 0 CT1 8-18 (SUTURE) ×10
SUT VIC AB 2-0 CT1 27 (SUTURE) ×5
SUT VIC AB 2-0 CT1 TAPERPNT 27 (SUTURE) ×3 IMPLANT
SUT VIC AB 4-0 PS2 27 (SUTURE) ×2 IMPLANT
SUT VICRYL 0 TIES 12 18 (SUTURE) ×5 IMPLANT
SYR BULB IRRIGATION 50ML (SYRINGE) ×2 IMPLANT
SYR CONTROL 10ML LL (SYRINGE) ×2 IMPLANT
TOWEL OR 17X24 6PK STRL BLUE (TOWEL DISPOSABLE) ×10 IMPLANT
TRAY FOLEY BAG SILVER LF 16FR (SET/KITS/TRAYS/PACK) ×5 IMPLANT

## 2014-12-13 NOTE — Brief Op Note (Signed)
12/13/2014  10:15 AM  PATIENT:  Samantha Schmitt  44 y.o. female  PRE-OPERATIVE DIAGNOSIS:  uterine fibroids, complex left ovarian cyst  POST-OPERATIVE DIAGNOSIS:  uterine fibroids, complex left ovarian cyst  PROCEDURE:  Procedure(s) with comments: HYSTERECTOMY ABDOMINAL (N/A) - 3 hours SALPINGO OOPHORECTOMY (Left) RIGHT SALPINGECTOMY (Right)  Collection of pelvic washings  SURGEON:  Surgeon(s) and Role:    * Keondria Siever E Yisroel Ramming, MD - Primary    * Megan Salon, MD - Assisting  PHYSICIAN ASSISTANT: NA  ASSISTANTS:  Lyman Speller, Md   ANESTHESIA:   general and  subcutaneous Experil and Marcaine 0.25%  EBL:  Total I/O In: 2500 [I.V.:2500] Out: 500 [Urine:450; Blood:50]  BLOOD ADMINISTERED:none  DRAINS: Urinary Catheter (Foley)   LOCAL MEDICATIONS USED:  MARCAINE    and Amount: 10 ml: Experil and Amount 20 cc.  SPECIMEN:  Source of Specimen:   Uterus, cervix, bilateral tubes, left ovary, collection of pelvic washings.  DISPOSITION OF SPECIMEN:  PATHOLOGY  COUNTS:  YES  TOURNIQUET:  * No tourniquets in log *  DICTATION: .Other Dictation: Dictation Number    PLAN OF CARE: Admit to inpatient   PATIENT DISPOSITION:  PACU - hemodynamically stable.   Delay start of Pharmacological VTE agent (>24hrs) due to surgical blood loss or risk of bleeding: not applicable

## 2014-12-13 NOTE — Progress Notes (Signed)
Day of Surgery Procedure(s) (LRB): HYSTERECTOMY ABDOMINAL (N/A) SALPINGO OOPHORECTOMY (Left) RIGHT SALPINGECTOMY (Right)  Subjective: Patient reports dizziness with taking a few steps.  Good pain control.  Tolerating cleaer liquids.   Objective: I have reviewed patient's vital signs and intake and output. BP 160/97 Pulse 60 RR 12  General: alert and cooperative Resp: clear to auscultation bilaterally Cardio: regular rate and rhythm, S1, S2 normal, no murmur, click, rub or gallop GI: incision: clean, dry and intact and  Soft, nontender, absent bowel sounds.  Extremities: extremities normal, atraumatic, no cyanosis or edema and PAS and Ted hose on.  DPs 2+.  Vaginal Bleeding: none  Assessment: s/p Procedure(s) with comments: HYSTERECTOMY ABDOMINAL (N/A) - 3 hours SALPINGO OOPHORECTOMY (Left) RIGHT SALPINGECTOMY (Right): stable  BP elevation is new for patient, and I believe it is a stress response.   Plan: Encourage ambulation Continue foley due to  post op status.   Dilaudid PCA and Toradol for pain.  CBC and BMP in am.  Discussed surgical findings and procedure.  Questions answered.   LOS: 0 days    Darcel Bayley 12/13/2014, 4:47 PM

## 2014-12-13 NOTE — Anesthesia Postprocedure Evaluation (Signed)
  Anesthesia Post-op Note  Patient: Samantha Schmitt  Procedure(s) Performed: Procedure(s) (LRB): HYSTERECTOMY ABDOMINAL (N/A) SALPINGO OOPHORECTOMY (Left) RIGHT SALPINGECTOMY (Right)  Patient Location: PACU  Anesthesia Type: General  Level of Consciousness: awake and alert   Airway and Oxygen Therapy: Patient Spontanous Breathing  Post-op Pain: mild  Post-op Assessment: Post-op Vital signs reviewed, Patient's Cardiovascular Status Stable, Respiratory Function Stable, Patent Airway and No signs of Nausea or vomiting  Last Vitals:  Filed Vitals:   12/13/14 0607  BP: 140/93  Pulse: 76  Temp: 36.7 C  Resp: 18    Post-op Vital Signs: stable   Complications: No apparent anesthesia complications

## 2014-12-13 NOTE — Progress Notes (Signed)
Update Note  No marked change in status since office preop visit.  Patient examined.  OK to proceed with surgery.

## 2014-12-13 NOTE — Transfer of Care (Signed)
Immediate Anesthesia Transfer of Care Note  Patient: Samantha Schmitt  Procedure(s) Performed: Procedure(s) with comments: HYSTERECTOMY ABDOMINAL (N/A) - 3 hours SALPINGO OOPHORECTOMY (Left) RIGHT SALPINGECTOMY (Right)  Patient Location: PACU  Anesthesia Type:General  Level of Consciousness: awake  Airway & Oxygen Therapy: Patient Spontanous Breathing  Post-op Assessment: Report given to PACU RN  Post vital signs: stable  Filed Vitals:   12/13/14 0607  BP: 140/93  Pulse: 76  Temp: 36.7 C  Resp: 18    Complications: No apparent anesthesia complications

## 2014-12-14 ENCOUNTER — Encounter (HOSPITAL_COMMUNITY): Payer: Self-pay | Admitting: Obstetrics and Gynecology

## 2014-12-14 LAB — CBC
HEMATOCRIT: 29.3 % — AB (ref 36.0–46.0)
Hemoglobin: 10.1 g/dL — ABNORMAL LOW (ref 12.0–15.0)
MCH: 28.9 pg (ref 26.0–34.0)
MCHC: 34.5 g/dL (ref 30.0–36.0)
MCV: 83.7 fL (ref 78.0–100.0)
PLATELETS: 203 10*3/uL (ref 150–400)
RBC: 3.5 MIL/uL — ABNORMAL LOW (ref 3.87–5.11)
RDW: 12.9 % (ref 11.5–15.5)
WBC: 7.9 10*3/uL (ref 4.0–10.5)

## 2014-12-14 LAB — BASIC METABOLIC PANEL
Anion gap: 6 (ref 5–15)
BUN: 8 mg/dL (ref 6–20)
CALCIUM: 8.1 mg/dL — AB (ref 8.9–10.3)
CO2: 27 mmol/L (ref 22–32)
CREATININE: 0.74 mg/dL (ref 0.44–1.00)
Chloride: 99 mmol/L — ABNORMAL LOW (ref 101–111)
Glucose, Bld: 115 mg/dL — ABNORMAL HIGH (ref 70–99)
POTASSIUM: 3.3 mmol/L — AB (ref 3.5–5.1)
Sodium: 132 mmol/L — ABNORMAL LOW (ref 135–145)

## 2014-12-14 MED FILL — Heparin Sodium (Porcine) Inj 5000 Unit/ML: INTRAMUSCULAR | Qty: 1 | Status: AC

## 2014-12-14 NOTE — Progress Notes (Signed)
1 Day Post-Op Procedure(s) (LRB): HYSTERECTOMY ABDOMINAL (N/A) SALPINGO OOPHORECTOMY (Left) RIGHT SALPINGECTOMY (Right)  Subjective: Patient reports tolerating PO.   Tolerating clear liquids.  Foley out.  No void yet.   Objective: I have reviewed patient's vital signs, intake and output and labs. Hgb 10. 1  General: alert and cooperative Resp: clear to auscultation bilaterally Cardio: regular rate and rhythm, S1, S2 normal, no murmur, click, rub or gallop GI: soft, non-tender; bowel sounds normal; no masses,  no organomegaly and incision: clean, dry and intact Vaginal Bleeding: none Bowel sounds scant but present.   Assessment: s/p Procedure(s) with comments: HYSTERECTOMY ABDOMINAL (N/A) - 3 hours SALPINGO OOPHORECTOMY (Left) RIGHT SALPINGECTOMY (Right): progressing well Anemia post op.   Plan: Advance diet Encourage ambulation Advance to PO medication  CBC in am tomorrow.    LOS: 1 day    Darcel Bayley 12/14/2014, 8:04 AM

## 2014-12-14 NOTE — Op Note (Signed)
NAMESAVANNAHA, Samantha Schmitt                  ACCOUNT NO.:  [id number]  MEDICAL RECORD NO.:  [id number]  LOCATION:  6967                          FACILITY:  [facility]  PHYSICIAN:  Lenard Galloway, M.D.   DATE OF BIRTH:  03/07/1971  DATE OF PROCEDURE:  12/12/2014 DATE OF DISCHARGE:                              OPERATIVE REPORT   PREOPERATIVE DIAGNOSES:  Uterine fibroids, complex left ovarian cyst.  POSTOPERATIVE DIAGNOSES:  Uterine fibroids, complex left ovarian cyst.  PROCEDURE:  Total abdominal hysterectomy with bilateral salpingectomy and left oophorectomy, collection of pelvic washings.  SURGEON:  Lenard Galloway, M.D.  ASSISTANT:  Lyman Speller MD  ANESTHESIA:  General endotracheal, subcutaneous Exparel 20 mL with Marcaine 0.25% 10 mL.  IV FLUIDS:  2500 mL Ringer's lactate.  ESTIMATED BLOOD LOSS:  50 mL.  URINE OUTPUT:  450 mL.  COMPLICATIONS:  None.  INDICATIONS FOR THE PROCEDURE:  The patient is a 44 year old, gravida 0, Caucasian female, who presented for routine examination and was noted to have a uterus which measured approximately 20 week size.  The patient initially was treated with depo Lupron therapy with the goal of reduction of overall uterine size for an attempted robotic hysterectomy. The patient had 2 very large fibroids.  One measuring 9 cm and one measuring 8 cm.  She did not have significant reduction in volume with the depo Lupron.  During this period of time, she did have development of a complex left ovarian cyst which measured 2.8 cm.  A CA-125 on September 15, 2014, measured 6.  The patient declines any future childbearing.  Plan is made to proceed at this time with a total abdominal hysterectomy, bilateral salpingectomy, left oophorectomy, and collection of pelvic washings.  Risks, benefits, and alternatives have been discussed with the patient and wishes to proceed.  FINDINGS:  Exam under anesthesia revealed a 16-17 week size uterus which felt more tall  than wide.  The adnexa were not palpated separately from the uterus.  At the time of laparotomy, the patient was noted to have a very large subserosal anterior fibroid which measured approximately 10 cm in its largest diameter.  There was also a very large left posterior subserosal fibroid which was approximately 7-8 cm in diameter.  There were multiple other fibroids including a small 0.75 cm left anterior cervical fibroid. The left ovary was somewhat generous in size and measured approximately 3.5 cm.  The surface was smooth and there were no obvious papillations or excrescences noted.  The right ovary and the bilateral fallopian tubes were unremarkable.  SPECIMENS:  Pelvic washings were collected at the beginning of the surgery and were sent to Pathology.  The uterus, cervix, bilateral fallopian tubes, and the left ovary were sent to Pathology together.  DESCRIPTION OF PROCEDURE:  The patient was reidentified in the preoperative hold area.  She received cefotetan 2 g IV.  She received TED hose and PAS stockings.  In the operating room, the patient was placed in the dorsal lithotomy position on the operating room table.  Allen stirrups were used. General endotracheal anesthesia was then induced.  The lower abdomen, vagina, and perineum were sterilely prepped and the  patient was sterilely draped.  A Foley catheter was placed inside the patient's bladder prior to draping.  This was left to gravity drainage throughout the procedure.  The procedure began by creating a Pfannenstiel incision with a scalpel. The incision was carried down to the fascia using monopolar cautery for hemostasis.  The fascia was incised in the midline and the fascial incision was extended bilaterally using the Mayo scissors.  The parietal peritoneum was elevated with 2 hemostat clamps and was entered sharply with a Metzenbaum scissors.  The peritoneal incision was extended cranially and caudally.  Pelvic  washings were performed and sent to pathology.   The uterus was delivered up through the Pfannenstiel incision.  Two moistened lap pads were placed in the abdomen to retract the bowel away from the surgical field.  The hysterectomy was performed without a self- retaining retractor.  Long Kelly clamps were placed across the adnexal structures bilaterally. The right round ligament was then identified and elevated with a Babcock clamp.  It was suture ligated with a transfixing suture of 0 Vicryl.  It was then divided with monopolar cautery.  The peritoneum of the broad ligament was opened anteriorly and posteriorly using monopolar cautery and a Metzenbaum scissors.  At this time, a right salpingectomy was performed.  Monopolar cautery was used to disconnect the distal fallopian tube from the ovary.  The window was then created through the mesosalpinx and the mesosalpinx was clamped, sharply divided, and then ligated with a free tie of 0 Vicryl. Dissection continued through the mesosalpinx which was further clamped, sharply divided, and was then ligated with a free tie of 0 Vicryl.  The proximal fallopian tube was bissected at this time using monopolar cautery.  The specimen was set aside and then sent to Pathology with the Other specimens at the end of the surgery.  A Heaney clamp was placed across the right utero-ovarian ligament.  The ligament was sharply divided and was then ligated with a free tie of 0 Vicryl followed by suture ligature of the same.  The bladder flap was taken down anteriorly with sharp dissection with a Metzenbaum scissors.  The posterior peritoneum was similarly taken down using the Metzenbaum scissors.  Attention was then turned to the patient's left-hand side.  The left round ligament was grasped with the pickups and was suture ligated with a transfixing suture of 0 Vicryl.  The ligament was divided with monopolar cautery.  The broad ligament was then opened  anteriorly and posteriorly using monopolar cautery and using a Metzenbaum scissors. The ureter was identified on the patient's left-hand side.  The window was created through the peritoneum of the broad ligament.  The infundibulopelvic ligament was then doubly clamped.  It was sharply divided, and it was free tied with 0 Vicryl followed by a suture ligature of the same.  The bladder flap was taken down anteriorly on the patient's left-hand side.  The posterior peritoneum was taken down similarly posteriorly using the Metzenbaum scissors and monopolar cautery.  The bladder was dissected off the lower uterine segment and the cervix using sharp dissection.  Each of the uterine arteries were skeletonized at this time.  The uterine arteries on each side were doubly clamped, sharply divided, and then suture ligated twice with 0 Vicryl.  The uterine fundus was amputated from the cervix at this time using monopolar cautery.  This specimen was set aside until the cervix was removed and then all of the specimen was sent to Pathology together.  Kocher clamps were placed on the anterior and posterior cervical lips. Continued dissection of the bladder off the cervix was performed. Straight Heaney clamps were placed along the inferior aspects of the cardinal ligaments.  The pedicles were sharply divided and then suture ligated with 0 Vicryl.  Curved Haney clamps were used to then come across the uterosacral ligaments bilaterally.  The pedicles were sharply divided and then suture ligated with a transfixing suture of 0 Vicryl bilaterally.  The cervix was further circumscribed using a curved Mayo scissors and the cervix was free and then was sent to Pathology.  An angle suture was created bilaterally at the apices of the vaginal cuff.  The remainder of the vaginal cuff was then closed tightly with a figure-of-eight 0 simple suture of 0 Vicryl.  The pelvis was irrigated and suctioned at this time  and the pedicles were all re-examined.  There was a small amount of bleeding noted along the peritoneal edge of the left pelvic sidewall and this responded to monopolar cautery.  There was also a small vessel along the pelvic sidewall which was elevated and cauterized to create good hemostasis.  The remainder of the pedicles were hemostatic at this time.  All of the suture tags were cut.  The moistened lap pads were removed from the peritoneal cavity.  An exploration of the upper abdomen was performed and the liver, gallbladder, and periaortic region were all unremarkable. There were some small filmy adhesions in the right mid abdomen and some, but not all of these were lysed as there was a fairly large curtain of adhesions along the right abdominal sidewall.  The appendix was visualized and this was normal.  Hemostasis was good.  The abdomen was closed.  The parietal peritoneum was closed with a running suture of 2-0 Vicryl.  The rectus muscles were irrigated and suctioned and noted to be hemostatic.  The fascia was closed with a running suture of 0 Vicryl.  The subcutaneous layer was irrigated and suctioned, made hemostatic with monopolar cautery.  The Exparel with 0.25% Marcaine was next injected in the subcutaneous tissue and all 30 mL were used.  Hemostasis was good.  The subcutaneous layer was closed with interrupted sutures of 2-0 Vicryl.  The skin was closed with a subcuticular suture of 4-0 Vicryl.  Steri-Strips and benzoin were placed over the incision and the incision was covered with a sterile bandage.  The patient's Foley catheter was left to gravity drainage.  This concluded the patient's procedure.  There were no complications.  All needle, instrument, and sponge counts were correct. The patient was escorted to the recovery room in stable and awake condition.      Lenard Galloway, M.D.     BES/MEDQ  D:  12/13/2014  T:  12/14/2014  Job:  [id number]

## 2014-12-15 ENCOUNTER — Telehealth: Payer: Self-pay | Admitting: Obstetrics and Gynecology

## 2014-12-15 LAB — CBC
HEMATOCRIT: 25.9 % — AB (ref 36.0–46.0)
HEMOGLOBIN: 8.8 g/dL — AB (ref 12.0–15.0)
MCH: 28.8 pg (ref 26.0–34.0)
MCHC: 34 g/dL (ref 30.0–36.0)
MCV: 84.6 fL (ref 78.0–100.0)
Platelets: 162 10*3/uL (ref 150–400)
RBC: 3.06 MIL/uL — AB (ref 3.87–5.11)
RDW: 12.8 % (ref 11.5–15.5)
WBC: 5.2 10*3/uL (ref 4.0–10.5)

## 2014-12-15 MED ORDER — OXYCODONE-ACETAMINOPHEN 5-325 MG PO TABS: 1.0000 | ORAL_TABLET | ORAL | Status: DC | PRN

## 2014-12-15 MED ORDER — FERROUS SULFATE 325 (65 FE) MG PO TBEC: 325.0000 mg | DELAYED_RELEASE_TABLET | Freq: Three times a day (TID) | ORAL | Status: DC

## 2014-12-15 MED ORDER — IBUPROFEN 600 MG PO TABS: 600.0000 mg | ORAL_TABLET | Freq: Four times a day (QID) | ORAL | Status: DC | PRN

## 2014-12-15 MED ORDER — DOCUSATE SODIUM 100 MG PO CAPS: 100.0000 mg | ORAL_CAPSULE | Freq: Every day | ORAL | Status: DC

## 2014-12-15 NOTE — Telephone Encounter (Signed)
Spoke with patient. Advised of message as seen below from Foley. Appointment moved to office visit tomorrow at 10:30am with Dr.Silva. Patient is agreeable.  Routing to provider for final review. Patient agreeable to disposition. Patient aware MD will review message and nurse will return call with any additional instructions or change of disposition. Will close encounter.

## 2014-12-15 NOTE — Telephone Encounter (Signed)
This is an appointment to see me and not just a lab. I will do a point of care Hgb.

## 2014-12-15 NOTE — Discharge Instructions (Signed)
Abdominal Hysterectomy, Care After Refer to this sheet in the next few weeks. These instructions provide you with information on caring for yourself after your procedure. Your health care provider may also give you more specific instructions. Your treatment has been planned according to current medical practices, but problems sometimes occur. Call your health care provider if you have any problems or questions after your procedure.  WHAT TO EXPECT AFTER THE PROCEDURE After your procedure, it is typical to have the following:  Pain.  Feeling tired.  Poor appetite.  Less interest in sex. HOME CARE INSTRUCTIONS  It takes 4-6 weeks to recover from this surgery. Make sure you follow all your health care provider's instructions. Home care instructions may include:  Take pain medicines only as directed by your health care provider. Do not take over-the-counter pain medicines without checking with your health care provider first.  Change your bandage as directed by your health care provider.  Return to your health care provider to have your sutures taken out.  Take showers instead of baths for 2-3 weeks. Ask your health care provider when it is safe to start showering.  Do not douche, use tampons, or have sexual intercourse for at least 6 weeks or until your health care provider says you can.   Follow your health care provider's advice about exercise, lifting, driving, and general activities.  Get plenty of rest and sleep.   Do not lift anything heavier than a gallon of milk (about 10 lb [4.5 kg]) for the first month after surgery.  You can resume your normal diet if your health care provider says it is okay.   Do not drink alcohol until your health care provider says you can.   If you are constipated, ask your health care provider if you can take a mild laxative.  Eating foods high in fiber may also help with constipation. Eat plenty of raw fruits and vegetables, whole grains, and  beans.  Drink enough fluids to keep your urine clear or pale yellow.   Try to have someone at home with you for the first 1-2 weeks to help around the house.  Keep all follow-up appointments. SEEK MEDICAL CARE IF:   You have chills or fever.  You have swelling, redness, or pain in the area of your incision that is getting worse.   You have pus coming from the incision.   You notice a bad smell coming from the incision or bandage.   Your incision breaks open.   You feel dizzy or light-headed.   You have pain or bleeding when you urinate.   You have persistent diarrhea.   You have persistent nausea and vomiting.   You have abnormal vaginal discharge.   You have a rash.   You have any type of abnormal reaction or develop an allergy to your medicine.   Your pain medicine is not helping.  SEEK IMMEDIATE MEDICAL CARE IF:   You have a fever and your symptoms suddenly get worse.  You have severe abdominal pain.  You have chest pain.  You have shortness of breath.  You faint.  You have pain, swelling, or redness of your leg.  You have heavy vaginal bleeding with blood clots. MAKE SURE YOU:  Understand these instructions.  Will watch your condition.  Will get help right away if you are not doing well or get worse. Document Released: 02/15/2005 Document Revised: 08/03/2013 Document Reviewed: 05/21/2013 Exit[employer] Patient Information 2015 [employer]. This information is not intended   to replace advice given to you by your health care provider. Make sure you discuss any questions you have with your health care provider.  

## 2014-12-15 NOTE — Telephone Encounter (Signed)
Spoke with patient. Patient had surgery with Dr.Silva on 5/3 for abdominal hysterectomy. Patient is being discharged today from the hospital but was advised by Dr.Silva to come in for repeat lab work tomorrow morning in the office. Lab appointment scheduled for tomorrow at 10am. Patient is agreeable to date and time. Will need orders placed.

## 2014-12-15 NOTE — Progress Notes (Signed)
2 Days Post-Op Procedure(s) (LRB): HYSTERECTOMY ABDOMINAL (N/A) SALPINGO OOPHORECTOMY (Left) RIGHT SALPINGECTOMY (Right)  Subjective: Patient reports tolerating PO, + flatus and no problems voiding.   Denies dizziness, lightheadedness, palpitations, headache, or maise.  Feels good and is ready for discharge to home.   Objective: I have reviewed patient's vital signs, intake and output, labs and pathology. T 98.6, BP 102/67, P 72, RR 18.  UO 350 cc last shift.  Hgb 8.8. Pathology - fibroids, benign endometrial polyp, benign left mucinous cystadenoma of ovary.  General: alert and cooperative GI: soft, non-tender; bowel sounds normal; no masses,  no organomegaly and incision: clean, dry and intact  Assessment: s/p Procedure(s) with comments: HYSTERECTOMY ABDOMINAL (N/A) - 3 hours SALPINGO OOPHORECTOMY (Left) RIGHT SALPINGECTOMY (Right): progressing well and anemia post op.  Patient is tolerating anemia and has no acute abdominal signs.  Ready for discharge.   Plan: Discharge home  Rx for Percocet and Motrin.  Will have patient take FeSO4 325 mg po tid with meals.  Instructions and precautions given in verbal and written form.  Specific anemia precautions given.  Pathology report discussed.  Patient will follow up in the am for a recheck in the office.    LOS: 2 days    Samantha Schmitt 12/15/2014, 7:43 AM

## 2014-12-15 NOTE — Telephone Encounter (Signed)
Patient was told to come in tomorrow 12/16/14 for follow up labs. Patient in unclear if she was supposed to see Dr.Silva or just return for labs. Last seen 12/13/14.

## 2014-12-15 NOTE — Progress Notes (Signed)
Pt is discharged in the care of [place] [facility]. Denies any pain or discomfort. No equipment needed for home use. Stable. Discharged instructions with Rx were given to pt. Understands all instructions well .Questions were asked and answered. Stable.

## 2014-12-16 ENCOUNTER — Encounter (HOSPITAL_COMMUNITY): Payer: Self-pay | Admitting: Certified Registered Nurse Anesthetist

## 2014-12-16 ENCOUNTER — Other Ambulatory Visit: Payer: BLUE CROSS/BLUE SHIELD

## 2014-12-16 ENCOUNTER — Inpatient Hospital Stay (HOSPITAL_COMMUNITY)
Admission: AD | Admit: 2014-12-16 | Discharge: 2014-12-19 | DRG: 921 | Disposition: A | Discharge status: Home / Self Care | Payer: BLUE CROSS/BLUE SHIELD | Source: Ambulatory Visit | Attending: Obstetrics and Gynecology | Admitting: Obstetrics and Gynecology

## 2014-12-16 ENCOUNTER — Encounter: Payer: Self-pay | Admitting: Obstetrics and Gynecology

## 2014-12-16 ENCOUNTER — Ambulatory Visit (INDEPENDENT_AMBULATORY_CARE_PROVIDER_SITE_OTHER): Payer: BLUE CROSS/BLUE SHIELD | Admitting: Obstetrics and Gynecology

## 2014-12-16 ENCOUNTER — Inpatient Hospital Stay (HOSPITAL_COMMUNITY): Payer: BLUE CROSS/BLUE SHIELD

## 2014-12-16 ENCOUNTER — Encounter (HOSPITAL_COMMUNITY): Payer: Self-pay

## 2014-12-16 VITALS — BP 138/78 | HR 88 | Ht 66.0 in | Wt 224.6 lb

## 2014-12-16 DIAGNOSIS — R269 Unspecified abnormalities of gait and mobility: Secondary | ICD-10-CM | POA: Diagnosis present

## 2014-12-16 DIAGNOSIS — Y838 Other surgical procedures as the cause of abnormal reaction of the patient, or of later complication, without mention of misadventure at the time of the procedure: Secondary | ICD-10-CM | POA: Diagnosis present

## 2014-12-16 DIAGNOSIS — Z79899 Other long term (current) drug therapy: Secondary | ICD-10-CM

## 2014-12-16 DIAGNOSIS — R5082 Postprocedural fever: Secondary | ICD-10-CM | POA: Diagnosis present

## 2014-12-16 DIAGNOSIS — T819XXA Unspecified complication of procedure, initial encounter: Secondary | ICD-10-CM

## 2014-12-16 DIAGNOSIS — Z881 Allergy status to other antibiotic agents status: Secondary | ICD-10-CM

## 2014-12-16 DIAGNOSIS — M545 Low back pain: Secondary | ICD-10-CM | POA: Diagnosis present

## 2014-12-16 DIAGNOSIS — D649 Anemia, unspecified: Secondary | ICD-10-CM | POA: Diagnosis present

## 2014-12-16 DIAGNOSIS — M216X2 Other acquired deformities of left foot: Secondary | ICD-10-CM | POA: Diagnosis present

## 2014-12-16 DIAGNOSIS — N99820 Postprocedural hemorrhage and hematoma of a genitourinary system organ or structure following a genitourinary system procedure: Secondary | ICD-10-CM | POA: Diagnosis not present

## 2014-12-16 DIAGNOSIS — M216X1 Other acquired deformities of right foot: Secondary | ICD-10-CM | POA: Diagnosis present

## 2014-12-16 DIAGNOSIS — R51 Headache: Secondary | ICD-10-CM | POA: Diagnosis present

## 2014-12-16 DIAGNOSIS — D6489 Other specified anemias: Secondary | ICD-10-CM

## 2014-12-16 DIAGNOSIS — Z90721 Acquired absence of ovaries, unilateral: Secondary | ICD-10-CM | POA: Diagnosis present

## 2014-12-16 DIAGNOSIS — Z90710 Acquired absence of both cervix and uterus: Secondary | ICD-10-CM

## 2014-12-16 LAB — ABO/RH: ABO/RH(D): A POS

## 2014-12-16 LAB — COMPREHENSIVE METABOLIC PANEL
ALBUMIN: 3.7 g/dL (ref 3.5–5.0)
ALT: 17 U/L (ref 14–54)
ANION GAP: 5 (ref 5–15)
AST: 21 U/L (ref 15–41)
Alkaline Phosphatase: 38 U/L (ref 38–126)
BUN: 7 mg/dL (ref 6–20)
CO2: 27 mmol/L (ref 22–32)
Calcium: 8.6 mg/dL — ABNORMAL LOW (ref 8.9–10.3)
Chloride: 105 mmol/L (ref 101–111)
Creatinine, Ser: 0.71 mg/dL (ref 0.44–1.00)
GFR calc Af Amer: >60 mL/min (ref >60)
GFR calc non Af Amer: >60 mL/min (ref >60)
Glucose, Bld: 99 mg/dL (ref 70–99)
POTASSIUM: 4.1 mmol/L (ref 3.5–5.1)
SODIUM: 137 mmol/L (ref 135–145)
Total Bilirubin: 0.6 mg/dL (ref 0.3–1.2)
Total Protein: 6.1 g/dL — ABNORMAL LOW (ref 6.5–8.1)

## 2014-12-16 LAB — CBC
HCT: 22 % — ABNORMAL LOW (ref 36.0–46.0)
Hemoglobin: 7.5 g/dL — ABNORMAL LOW (ref 12.0–15.0)
MCH: 29.3 pg (ref 26.0–34.0)
MCHC: 34.1 g/dL (ref 30.0–36.0)
MCV: 85.9 fL (ref 78.0–100.0)
Platelets: 148 10*3/uL — ABNORMAL LOW (ref 150–400)
RBC: 2.56 MIL/uL — ABNORMAL LOW (ref 3.87–5.11)
RDW: 12.9 % (ref 11.5–15.5)
WBC: 4.5 10*3/uL (ref 4.0–10.5)

## 2014-12-16 LAB — PREPARE RBC (CROSSMATCH)

## 2014-12-16 MED ORDER — LACTATED RINGERS IV SOLN
INTRAVENOUS | Status: DC
  Administered 2014-12-16: 20:00:00 via INTRAVENOUS

## 2014-12-16 MED ORDER — HYDROMORPHONE HCL 1 MG/ML IJ SOLN: 1.0000 mg | INTRAMUSCULAR | Status: DC | PRN

## 2014-12-16 MED ORDER — IOHEXOL 300 MG/ML  SOLN
100.0000 mL | Freq: Once | INTRAMUSCULAR | Status: AC | PRN
  Administered 2014-12-16: 100 mL via INTRAVENOUS

## 2014-12-16 MED ORDER — MORPHINE SULFATE 4 MG/ML IJ SOLN
4.0000 mg | Freq: Once | INTRAMUSCULAR | Status: AC
  Administered 2014-12-16: 4 mg via INTRAVENOUS
  Filled 2014-12-16: qty 1

## 2014-12-16 MED ORDER — ONDANSETRON HCL 4 MG/2ML IJ SOLN: 4.0000 mg | Freq: Four times a day (QID) | INTRAMUSCULAR | Status: DC | PRN

## 2014-12-16 MED ORDER — SODIUM CHLORIDE 0.9 % IV SOLN: Freq: Once | INTRAVENOUS | Status: DC

## 2014-12-16 MED ORDER — DEXTROSE 5 % IV SOLN
2.0000 g | Freq: Two times a day (BID) | INTRAVENOUS | Status: DC
  Administered 2014-12-16 – 2014-12-19 (×6): 2 g via INTRAVENOUS
  Filled 2014-12-16 (×6): qty 2

## 2014-12-16 MED ORDER — DIPHENHYDRAMINE HCL 50 MG/ML IJ SOLN
25.0000 mg | Freq: Once | INTRAMUSCULAR | Status: AC
Start: 2014-12-16 — End: 2014-12-16
  Administered 2014-12-16: 25 mg via INTRAVENOUS
  Filled 2014-12-16: qty 1

## 2014-12-16 MED ORDER — ACETAMINOPHEN 325 MG PO TABS
650.0000 mg | ORAL_TABLET | Freq: Four times a day (QID) | ORAL | Status: DC | PRN
  Administered 2014-12-17 – 2014-12-19 (×4): 650 mg via ORAL
  Filled 2014-12-16 (×4): qty 2

## 2014-12-16 MED ORDER — SODIUM CHLORIDE 0.9 % IV SOLN
INTRAVENOUS | Status: DC
  Administered 2014-12-17 (×3): via INTRAVENOUS

## 2014-12-16 MED ORDER — ACETAMINOPHEN 325 MG PO TABS
650.0000 mg | ORAL_TABLET | Freq: Once | ORAL | Status: AC
  Administered 2014-12-16: 650 mg via ORAL
  Filled 2014-12-16: qty 2

## 2014-12-16 MED ORDER — IOHEXOL 300 MG/ML  SOLN: 50.0000 mL | INTRAMUSCULAR | Status: AC

## 2014-12-16 NOTE — Progress Notes (Signed)
Pt. Temp at 2050 is 100.9 oral medicated with tylenol will recheck temp in 30 mins before starting blood transfusion

## 2014-12-16 NOTE — Discharge Summary (Signed)
Physician Discharge Summary  Patient ID: Samantha Schmitt MRN: [id number] DOB/AGE: [birth year] 44 y.o.  Admit date: 12/13/2014 Discharge date: 12/16/2014  Admission Diagnoses: Uterine fibroids. Complex left ovarian cyst.  Discharge Diagnoses: Uterine fibroids. Complex left ovarian cyst. Status post total abdominal hysterectomy with bilateral salpingectomy and left oophorectomy, collection of pelvic washings on 12/13/14.   Active Problems:   Status post hysterectomy   Status post total abdominal hysterectomy   Discharged Condition: good  Hospital Course: The patient was admitted on 12/13/14 for a total abdominal hysterectomy with bilateral salpingectomy and left oophorectomy, collection of pelvic washings which were performed without complication while under general anesthesia.  The patient's post op course was uneventful.  She had a Dilaudid PCA and Toradol for pain control initially, and this was converted over to Percocet and Motrin on post op day one when the patient began taking po well.  She ambulated independently and wore PAS and Ted hose for DVT prophylaxis while in bed.  Her foley catheter was removed on post op day one, and she voided good volumes.  The patient had elevated blood pressure just prior to surgery and during the immediate post op period.  She was encouraged to use her PCA.  Her blood pressures then ranged from 90s - 100s over 40s - 60s and her pulse remained in the 60s to 70s.  She demonstrated no signs of infection during her hospitalization.  The patient's post op day one Hgb was   10.1 and it went to 8.8 on post op day number two. The patient was demonstrating no clinical signs of anemia other than her hemoglobin level, and she was therefore discharged to home on iron sulfate with a plan for a recheck the following day in the office.  She was found to be in good condition and ready for discharge on post op day one.    Consults: None  Significant Diagnostic Studies: labs:  See  Hospital Course. Pathology report - benign endometrial polyp, fibroids, benign left ovarian mucinous cystadenoma.  Treatments: surgery:  total abdominal hysterectomy with bilateral salpingectomy and left oophorectomy, collection of pelvic washings on 12/13/14.  Discharge Exam: Blood pressure 102/67, pulse 72, temperature 98.6 F (37 C), temperature source Oral, resp. rate 18, height 5\' 6"  (1.676 m), weight 217 lb (98.431 kg), SpO2 100 %.  General: alert and cooperative GI: soft, non-tender; bowel sounds normal; no masses, no organomegaly and incision: clean, dry and intact  Disposition: 01-Home or Self Care Discharge to home.  Instructions and precautions given in verbal and written form.  Anemia precautions given.    Medication List    STOP taking these medications        leuprolide 3.75 MG injection  Commonly known as:  LUPRON DEPOT      TAKE these medications        calcium carbonate 500 MG chewable tablet  Commonly known as:  TUMS - dosed in mg elemental calcium  Chew 1 tablet by mouth daily.     docusate sodium 100 MG capsule  Commonly known as:  COLACE  Take 1 capsule (100 mg total) by mouth daily.     Echinacea 400 MG Caps  Take 400 mg by mouth daily.     ferrous sulfate 325 (65 FE) MG EC tablet  Take 1 tablet (325 mg total) by mouth 3 (three) times daily with meals.     Fish Oil 1000 MG Caps  Take 1,000 mg by mouth every other day. Take one every other  day     ibuprofen 600 MG tablet  Commonly known as:  ADVIL,MOTRIN  Take 1 tablet (600 mg total) by mouth every 6 (six) hours as needed (mild pain).     MSM PO  Take 1 tablet by mouth daily.     multivitamin capsule  Take 1 capsule by mouth daily.     oxyCODONE-acetaminophen 5-325 MG per tablet  Commonly known as:  PERCOCET/ROXICET  Take 1-2 tablets by mouth every 4 (four) hours as needed for severe pain (moderate to severe pain (when tolerating fluids)).     vitamin E 200 UNIT capsule  Take 200 Units by  mouth daily.           Follow-up Information    Follow up with Darcel Bayley, MD In 1 day.   Specialty:  Obstetrics and Gynecology   Contact information:   [address] [place] [address] [phone]       Signed: Aundria Rud, MD  12/16/2014, 6:24 PM

## 2014-12-16 NOTE — Progress Notes (Signed)
Temp 101.3 md notified at 2215 ordered to cont. With blood transfusion

## 2014-12-16 NOTE — MAU Note (Signed)
Pt sent from MD office, had hysterectomy on 5/3.  [place] home yesterday, had decreased Hgb today in office from yesterday.  Denies vaginal bleeding.  Pt states her pain meds are controlling her incisional pain.

## 2014-12-16 NOTE — MAU Note (Signed)
Urine in lab 

## 2014-12-16 NOTE — Progress Notes (Signed)
Patient ID: Samantha Schmitt, female   DOB: [birth year], 44 y.o.   MRN: [id number] GYNECOLOGY  VISIT   HPI: 44 y.o.   Single  Caucasian  female   G0P0 with Patient's last menstrual period was 08/18/2014.   here for 3 day post TAH/LSO and Rt. Salpingectomy and collection of pelvic washings.   Having bowel movements.  Walking around well.   Feels like heart is beating more.  Fingers tingling.  Little headache.  HGB was 8.8 yesterday.   HGB:  7.7 now.   Ate breakfast at 7:30 am today.  Taking iron.   Took Percocet and Motrin at 9:30.  Took Miralax this am also.   GYNECOLOGIC HISTORY: Patient's last menstrual period was 08/18/2014. Contraception:  Hysterectomy Menopausal hormone therapy: none        OB History    Gravida Para Term Preterm AB TAB SAB Ectopic Multiple Living   0                  Patient Active Problem List   Diagnosis Date Noted  . Status post hysterectomy 12/13/2014  . Status post total abdominal hysterectomy 12/13/2014  . Fibroids 09/23/2013  . 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 Foot pain 06/03/2012  . Pronation of feet 06/03/2012  . Gait abnormality 06/03/2012  . Swelling of foot joint 06/03/2012    Past Medical History  Diagnosis Date  . Lower back pain     L4-L5    Past Surgical History  Procedure Laterality Date  . Wisdom tooth extraction    . Abdominal hysterectomy N/A 12/13/2014    Procedure: HYSTERECTOMY ABDOMINAL;  Surgeon: Nunzio Cobbs, MD;  Location: [facility];  Service: Gynecology;  Laterality: N/A;  3 hours  . Salpingoophorectomy Left 12/13/2014    Procedure: SALPINGO OOPHORECTOMY;  Surgeon: Nunzio Cobbs, MD;  Location: [facility];  Service: Gynecology;  Laterality: Left;  . Unilateral salpingectomy Right 12/13/2014    Procedure: RIGHT SALPINGECTOMY;  Surgeon: Nunzio Cobbs, MD;  Location: [facility];  Service: Gynecology;  Laterality: Right;    Current Outpatient Prescriptions  Medication Sig Dispense  Refill  . calcium carbonate (TUMS - DOSED IN MG ELEMENTAL CALCIUM) 500 MG chewable tablet Chew 1 tablet by mouth daily.    Marland Kitchen docusate sodium (COLACE) 100 MG capsule Take 1 capsule (100 mg total) by mouth daily. 30 capsule 1  . Echinacea 400 MG CAPS Take 400 mg by mouth daily.    . ferrous sulfate 325 (65 FE) MG EC tablet Take 1 tablet (325 mg total) by mouth 3 (three) times daily with meals. 90 tablet 1  . ibuprofen (ADVIL,MOTRIN) 600 MG tablet Take 1 tablet (600 mg total) by mouth every 6 (six) hours as needed (mild pain). 30 tablet 0  . Methylsulfonylmethane (MSM PO) Take 1 tablet by mouth daily.     . Multiple Vitamin (MULTIVITAMIN) capsule Take 1 capsule by mouth daily.    . Omega-3 Fatty Acids (FISH OIL) 1000 MG CAPS Take 1,000 mg by mouth every other day. Take one every other day    . oxyCODONE-acetaminophen (PERCOCET/ROXICET) 5-325 MG per tablet Take 1-2 tablets by mouth every 4 (four) hours as needed for severe pain (moderate to severe pain (when tolerating fluids)). 30 tablet 0  . vitamin E 200 UNIT capsule Take 200 Units by mouth daily.     No current facility-administered medications for this visit.     ALLERGIES: Minocycline  Family History  Problem Relation Age of Onset  . Hypertension Mother   . Thyroid disease Mother     History   Social History  . Marital Status: Single    Spouse Name: N/A  . Number of Children: N/A  . Years of Education: N/A   Occupational History  . Not on file.   Social History Main Topics  . Smoking status: Never Smoker   . Smokeless tobacco: Never Used  . Alcohol Use: No  . Drug Use: No  . Sexual Activity: No     Comment: TAH/LSO/Rsalpingectomy   Other Topics Concern  . Not on file   Social History Narrative    ROS:  Pertinent items are noted in HPI.  PHYSICAL EXAMINATION:    BP 138/78 mmHg  Pulse 88  Ht 5\' 6"  (1.676 m)  Wt 224 lb 9.6 oz (101.878 kg)  BMI 36.27 kg/m2  LMP 08/18/2014     General appearance: alert,  cooperative and appears stated age.  Looks pale. Lungs: clear to auscultation bilaterally Heart: regular rate and rhythm Abdomen: incision with mild amount of bloody drainage and no tenderness or induration, soft, non-tender; no masses,  no organomegaly No abnormal inguinal nodes palpated  Pelvic: External genitalia:  no lesions              Urethra:  normal appearing urethra with no masses, tenderness or lesions              Bartholins and Skenes: normal                 Vagina: normal appearing vagina with normal color and discharge, no lesions, no blood.              Cervix:  absent                   Bimanual Exam:  Uterus:  Absent.                                      Adnexa:  No masses.  ASSESSMENT  Status post total abdominal hysterectomy with bilateral salpingectomy, left oophorectomy, collection of pelvic washings.  Postop anemia.   PLAN  Will have patient return to [facility] now.  Will check CBC. Type and screen.  Will do CT of abdomen and pelvis to rule out post op hematoma.  Discussed possible transfusion of 2 units of packed red blood cells.  Final plan to be determined based upon evaluation.  Discussed possible surgical re-exploration or interventional radiology to treat a specific bleeding vessel if found.  Patient's brother is able to take patient to hospital now.   An After Visit Summary was printed and given to the patient.

## 2014-12-16 NOTE — MAU Provider Note (Signed)
Chief Complaint: Anemia   First Provider Initiated Contact with Patient 12/16/14 1322      SUBJECTIVE HPI: Samantha Schmitt is a 44 y.o. G0P0 POD#2 following TAH/BSO who presents to maternity admissions sent from the office for dropping hgb.  Hgb last week was 14 per pt, then 10, then 8.8 and 7.7 in office today.  She denies abdominal pain or vaginal bleeding. She does report some tingling of her fingers and h/a yesterday but denies those symptoms today.  She denies chest pain or palpitations, dizziness, n/v, or fever/chills.     HPI  Past Medical History  Diagnosis Date  . Lower back pain     L4-L5   Past Surgical History  Procedure Laterality Date  . Wisdom tooth extraction    . Abdominal hysterectomy N/A 12/13/2014    Procedure: HYSTERECTOMY ABDOMINAL;  Surgeon: Nunzio Cobbs, MD;  Location: [facility];  Service: Gynecology;  Laterality: N/A;  3 hours  . Salpingoophorectomy Left 12/13/2014    Procedure: SALPINGO OOPHORECTOMY;  Surgeon: Nunzio Cobbs, MD;  Location: [facility];  Service: Gynecology;  Laterality: Left;  . Unilateral salpingectomy Right 12/13/2014    Procedure: RIGHT SALPINGECTOMY;  Surgeon: Nunzio Cobbs, MD;  Location: [facility];  Service: Gynecology;  Laterality: Right;   History   Social History  . Marital Status: Single    Spouse Name: N/A  . Number of Children: N/A  . Years of Education: N/A   Occupational History  . Not on file.   Social History Main Topics  . Smoking status: Never Smoker   . Smokeless tobacco: Never Used  . Alcohol Use: No  . Drug Use: No  . Sexual Activity: No     Comment: TAH/LSO/Rsalpingectomy   Other Topics Concern  . Not on file   Social History Narrative   No current facility-administered medications on file prior to encounter.   Current Outpatient Prescriptions on File Prior to Encounter  Medication Sig Dispense Refill  . calcium carbonate (TUMS - DOSED IN MG ELEMENTAL CALCIUM) 500 MG chewable tablet Chew  1 tablet by mouth daily.    Marland Kitchen docusate sodium (COLACE) 100 MG capsule Take 1 capsule (100 mg total) by mouth daily. 30 capsule 1  . Echinacea 400 MG CAPS Take 400 mg by mouth daily.    . ferrous sulfate 325 (65 FE) MG EC tablet Take 1 tablet (325 mg total) by mouth 3 (three) times daily with meals. 90 tablet 1  . ibuprofen (ADVIL,MOTRIN) 600 MG tablet Take 1 tablet (600 mg total) by mouth every 6 (six) hours as needed (mild pain). 30 tablet 0  . Methylsulfonylmethane (MSM PO) Take 1 tablet by mouth daily.     . Multiple Vitamin (MULTIVITAMIN) capsule Take 1 capsule by mouth daily.    . Omega-3 Fatty Acids (FISH OIL) 1000 MG CAPS Take 1,000 mg by mouth every other day. Take one every other day    . oxyCODONE-acetaminophen (PERCOCET/ROXICET) 5-325 MG per tablet Take 1-2 tablets by mouth every 4 (four) hours as needed for severe pain (moderate to severe pain (when tolerating fluids)). 30 tablet 0  . vitamin E 200 UNIT capsule Take 200 Units by mouth daily.     Allergies  Allergen Reactions  . Minocycline Other (See Comments)    Blurred vision and dizziness.    Review of Systems  Constitutional: Negative for fever, chills and malaise/fatigue.  Eyes: Negative for blurred vision.  Respiratory: Negative for cough and  shortness of breath.   Cardiovascular: Negative for chest pain.  Gastrointestinal: Negative for heartburn, nausea, vomiting and abdominal pain.  Genitourinary: Negative for dysuria, urgency and frequency.  Musculoskeletal: Negative.   Neurological: Positive for tingling and headaches. Negative for dizziness and weakness.  Psychiatric/Behavioral: Negative for depression.    OBJECTIVE Blood pressure 131/77, pulse 103, temperature 99 F (37.2 C), temperature source Oral, resp. rate 18, last menstrual period 08/18/2014. GENERAL: Well-developed, well-nourished female in no acute distress.  EYES: normal sclera/conjunctiva; no lid-lag HENT: Atraumatic, normocephalic HEART: normal  rate, heart sounds, regular rhythm RESP: normal effort, lung sounds clear and equal bilaterally ABDOMEN: Soft, non-tender MUSCULOSKELETAL: Normal ROM EXTREMITIES: Nontender, no edema NEURO/PSYCH: Alert and oriented, appropriate affect  PELVIC EXAM: Deferred--done in office 12/16/14   LAB RESULTS Results for orders placed or performed during the hospital encounter of 12/16/14 (from the past 24 hour(s))  CBC     Status: Abnormal   Collection Time: 12/16/14  1:40 PM  Result Value Ref Range   WBC 4.5 4.0 - 10.5 K/uL   RBC 2.56 (L) 3.87 - 5.11 MIL/uL   Hemoglobin 7.5 (L) 12.0 - 15.0 g/dL   HCT 22.0 (L) 36.0 - 46.0 %   MCV 85.9 78.0 - 100.0 fL   MCH 29.3 26.0 - 34.0 pg   MCHC 34.1 30.0 - 36.0 g/dL   RDW 12.9 11.5 - 15.5 %   Platelets 148 (L) 150 - 400 K/uL  Comprehensive metabolic panel     Status: Abnormal   Collection Time: 12/16/14  1:40 PM  Result Value Ref Range   Sodium 137 135 - 145 mmol/L   Potassium 4.1 3.5 - 5.1 mmol/L   Chloride 105 101 - 111 mmol/L   CO2 27 22 - 32 mmol/L   Glucose, Bld 99 70 - 99 mg/dL   BUN 7 6 - 20 mg/dL   Creatinine, Ser 0.71 0.44 - 1.00 mg/dL   Calcium 8.6 (L) 8.9 - 10.3 mg/dL   Total Protein 6.1 (L) 6.5 - 8.1 g/dL   Albumin 3.7 3.5 - 5.0 g/dL   AST 21 15 - 41 U/L   ALT 17 14 - 54 U/L   Alkaline Phosphatase 38 38 - 126 U/L   Total Bilirubin 0.6 0.3 - 1.2 mg/dL   GFR calc non Af Amer >60 >60 mL/min   GFR calc Af Amer >60 >60 mL/min   Anion gap 5 5 - 15    IMAGING Ct Abdomen Pelvis W Contrast  12/16/2014   CLINICAL DATA:  Status post hysterectomy, bilateral salpingectomy, and left oophorectomy on 12/13/2014, with anemia status post surgery, evaluate for hematoma.  EXAM: CT ABDOMEN AND PELVIS WITH CONTRAST  TECHNIQUE: Multidetector CT imaging of the abdomen and pelvis was performed using the standard protocol following bolus administration of intravenous contrast.  CONTRAST:  136mL OMNIPAQUE IOHEXOL 300 MG/ML  SOLN  COMPARISON:  None.  FINDINGS:  Lower chest:  Lung bases are clear.  Hepatobiliary: Liver is within normal limits.  Gallbladder is unremarkable. No intrahepatic or extrahepatic ductal dilatation.  Pancreas: Within normal limits.  Spleen: Within normal limits.  Adrenals/Urinary Tract: Adrenal glands are within normal limits.  Kidneys within normal limits.  No hydronephrosis.  Bladder is mildly thick-walled although underdistended.  Stomach/Bowel: Stomach is within normal limits.  No evidence of bowel obstruction.  Normal appendix.  Vascular/Lymphatic: No evidence of abdominal aortic aneurysm.  No suspicious abdominopelvic lymphadenopathy.  Reproductive: Status post hysterectomy and left salpingo-oophorectomy.  Right ovary is grossly unremarkable (series  2/image 70).  Other: Small to moderate layering pelvic hemorrhage.  Additional small volume perihepatic and perisplenic ascites, likely complicated by hemorrhage.  No evidence of active extravasation.  Scattered foci of nondependent gas beneath the anterior lower pelvic wall (series 2/ image 81) and within the subcutaneous tissues, postsurgical.  Musculoskeletal: Degenerative changes of the visualized thoracolumbar spine, most prominent at L4-5.  IMPRESSION: Status post hysterectomy and left salpingo-oophorectomy.  Small to moderate layering pelvic hemorrhage. Additional small volume perihepatic and perisplenic ascites, likely complicated by hemorrhage.  Postsurgical changes in the anterior abdominal wall with scattered foci of nondependent gas.   Electronically Signed   By: Julian Hy M.D.   On: 12/16/2014 16:28    ASSESSMENT 1. Postoperative complication     PLAN Consult Dr Quincy Simmonds Admit to [facility] for PRBCs Consult IR for further evaluation     Medication List    ASK your doctor about these medications        calcium carbonate 500 MG chewable tablet  Commonly known as:  TUMS - dosed in mg elemental calcium  Chew 1 tablet by mouth daily.     docusate sodium 100 MG  capsule  Commonly known as:  COLACE  Take 1 capsule (100 mg total) by mouth daily.     Echinacea 400 MG Caps  Take 400 mg by mouth daily.     ferrous sulfate 325 (65 FE) MG EC tablet  Take 1 tablet (325 mg total) by mouth 3 (three) times daily with meals.     Fish Oil 1000 MG Caps  Take 1,000 mg by mouth every other day. Take one every other day     ibuprofen 600 MG tablet  Commonly known as:  ADVIL,MOTRIN  Take 1 tablet (600 mg total) by mouth every 6 (six) hours as needed (mild pain).     MSM PO  Take 1 tablet by mouth daily.     multivitamin capsule  Take 1 capsule by mouth daily.     oxyCODONE-acetaminophen 5-325 MG per tablet  Commonly known as:  PERCOCET/ROXICET  Take 1-2 tablets by mouth every 4 (four) hours as needed for severe pain (moderate to severe pain (when tolerating fluids)).     vitamin E 200 UNIT capsule  Take 200 Units by mouth daily.         Fatima Blank Certified Nurse-Midwife 12/16/2014  5:41 PM

## 2014-12-16 NOTE — H&P (Signed)
Patient ID: Samantha Schmitt, female DOB: 11-23-1970, 44 y.o. MRN: [id number] GYNECOLOGY VISIT  HPI: 44 y.o. Single Caucasian female  G0P0 with Patient's last menstrual period was 08/18/2014.  here for 3 day post TAH/LSO and Rt. Salpingectomy and collection of pelvic washings.   Having bowel movements.  Walking around well.   Feels like heart is beating more.  Fingers tingling.  Little headache.  HGB was 8.8 yesterday.   HGB: 7.7 now.   Ate breakfast at 7:30 am today.  Taking iron.  Took Percocet and Motrin at 9:30.  Took Miralax this am also.   GYNECOLOGIC HISTORY: Patient's last menstrual period was 08/18/2014. Contraception: Hysterectomy Menopausal hormone therapy: none   OB History    Gravida Para Term Preterm AB TAB SAB Ectopic Multiple Living   0                Patient Active Problem List   Diagnosis Date Noted  . Status post hysterectomy 12/13/2014  . Status post total abdominal hysterectomy 12/13/2014  . Fibroids 09/23/2013  . 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 Foot pain 06/03/2012  . Pronation of feet 06/03/2012  . Gait abnormality 06/03/2012  . Swelling of foot joint 06/03/2012    Past Medical History  Diagnosis Date  . Lower back pain     L4-L5    Past Surgical History  Procedure Laterality Date  . Wisdom tooth extraction    . Abdominal hysterectomy N/A 12/13/2014    Procedure: HYSTERECTOMY ABDOMINAL; Surgeon: Nunzio Cobbs, MD; Location: [facility]; Service: Gynecology; Laterality: N/A; 3 hours  . Salpingoophorectomy Left 12/13/2014    Procedure: SALPINGO OOPHORECTOMY; Surgeon: Nunzio Cobbs, MD; Location: [facility]; Service: Gynecology; Laterality: Left;  . Unilateral salpingectomy Right 12/13/2014    Procedure: RIGHT SALPINGECTOMY; Surgeon: Nunzio Cobbs, MD; Location: [facility];  Service: Gynecology; Laterality: Right;    Current Outpatient Prescriptions  Medication Sig Dispense Refill  . calcium carbonate (TUMS - DOSED IN MG ELEMENTAL CALCIUM) 500 MG chewable tablet Chew 1 tablet by mouth daily.    Marland Kitchen docusate sodium (COLACE) 100 MG capsule Take 1 capsule (100 mg total) by mouth daily. 30 capsule 1  . Echinacea 400 MG CAPS Take 400 mg by mouth daily.    . ferrous sulfate 325 (65 FE) MG EC tablet Take 1 tablet (325 mg total) by mouth 3 (three) times daily with meals. 90 tablet 1  . ibuprofen (ADVIL,MOTRIN) 600 MG tablet Take 1 tablet (600 mg total) by mouth every 6 (six) hours as needed (mild pain). 30 tablet 0  . Methylsulfonylmethane (MSM PO) Take 1 tablet by mouth daily.     . Multiple Vitamin (MULTIVITAMIN) capsule Take 1 capsule by mouth daily.    . Omega-3 Fatty Acids (FISH OIL) 1000 MG CAPS Take 1,000 mg by mouth every other day. Take one every other day    . oxyCODONE-acetaminophen (PERCOCET/ROXICET) 5-325 MG per tablet Take 1-2 tablets by mouth every 4 (four) hours as needed for severe pain (moderate to severe pain (when tolerating fluids)). 30 tablet 0  . vitamin E 200 UNIT capsule Take 200 Units by mouth daily.     No current facility-administered medications for this visit.     ALLERGIES: Minocycline  Family History  Problem Relation Age of Onset  . Hypertension Mother   . Thyroid disease Mother     History   Social History  . Marital Status: Single    Spouse Name:  N/A  . Number of Children: N/A  . Years of Education: N/A   Occupational History  . Not on file.   Social History Main Topics  . Smoking status: Never Smoker   . Smokeless tobacco: Never Used  . Alcohol Use: No  . Drug Use: No  . Sexual Activity: No     Comment: TAH/LSO/Rsalpingectomy   Other Topics Concern  . Not on file   Social History  Narrative    ROS: Pertinent items are noted in HPI.  PHYSICAL EXAMINATION:   BP 138/78 mmHg  Pulse 88  Ht 5\' 6"  (1.676 m)  Wt 224 lb 9.6 oz (101.878 kg)  BMI 36.27 kg/m2  LMP 08/18/2014   General appearance: alert, cooperative and appears stated age. Looks pale. Lungs: clear to auscultation bilaterally Heart: regular rate and rhythm Abdomen: incision with mild amount of bloody drainage and no tenderness or induration, soft, non-tender; no masses, no organomegaly No abnormal inguinal nodes palpated  Pelvic: External genitalia: no lesions  Urethra: normal appearing urethra with no masses, tenderness or lesions  Bartholins and Skenes: normal   Vagina: normal appearing vagina with normal color and discharge, no lesions, no blood.  Cervix: absent    Bimanual Exam: Uterus: Absent.  Adnexa: No masses.  ASSESSMENT  Status post total abdominal hysterectomy with bilateral salpingectomy, left oophorectomy, collection of pelvic washings.  Postop anemia.   PLAN  Will have patient return to [facility] now.  Will check CBC. Type and screen.  Will do CT of abdomen and pelvis to rule out post op hematoma.  Discussed possible transfusion of 2 units of packed red blood cells. Final plan to be determined based upon evaluation.  Discussed possible surgical re-exploration or interventional radiology to treat a specific bleeding vessel if found.  Patient's brother is able to take patient to hospital now.        Addendum  Subjective   Patient is now feeling cold and can feel some headache.  Feels emotional also.  Patient's brother is at her bedside.   Objective   Temp 99, BP 131/77, P 103, RR 18.   Hgb 7.5, Hct 22.  Ca 8.6  Phone conversation with Dr. Earle Gell regarding CT of abdomen and pelvis showing 500 cc of blood in hysterectomy bed.  Small  amount of blood in paracolic gutters.  No active extravasation of blood noted with contrast.  Picture most consistent with venous oozing.  Postsurgical changes of the abdominal wall.   Assessment  Post op hematoma.  Anemia.   Plan  Admit for observation and transfusion of 2 units of packed red blood cells.  Discussed risks and benefits of transfusion.  Discussed CT scan findings.  Plan will be to keep NPO and recheck serial Hgbs.  If does not respond to transfusion, will contact interventional radiology about angiogram study.  Patient understands there is a possible need for surgical exploration, but that this is not indicated at this time.  Will prophylax with Cefotetan IV.  Dilaudid IV for pain.  Ted hose and PAS for DVT prophylaxis.

## 2014-12-17 DIAGNOSIS — Z79899 Other long term (current) drug therapy: Secondary | ICD-10-CM | POA: Diagnosis not present

## 2014-12-17 DIAGNOSIS — M216X1 Other acquired deformities of right foot: Secondary | ICD-10-CM | POA: Diagnosis present

## 2014-12-17 DIAGNOSIS — Z881 Allergy status to other antibiotic agents status: Secondary | ICD-10-CM | POA: Diagnosis not present

## 2014-12-17 DIAGNOSIS — N9489 Other specified conditions associated with female genital organs and menstrual cycle: Secondary | ICD-10-CM | POA: Diagnosis not present

## 2014-12-17 DIAGNOSIS — Z90721 Acquired absence of ovaries, unilateral: Secondary | ICD-10-CM | POA: Diagnosis present

## 2014-12-17 DIAGNOSIS — D649 Anemia, unspecified: Secondary | ICD-10-CM | POA: Diagnosis present

## 2014-12-17 DIAGNOSIS — Z90710 Acquired absence of both cervix and uterus: Secondary | ICD-10-CM | POA: Diagnosis not present

## 2014-12-17 DIAGNOSIS — R5082 Postprocedural fever: Secondary | ICD-10-CM | POA: Diagnosis present

## 2014-12-17 DIAGNOSIS — Y838 Other surgical procedures as the cause of abnormal reaction of the patient, or of later complication, without mention of misadventure at the time of the procedure: Secondary | ICD-10-CM | POA: Diagnosis present

## 2014-12-17 DIAGNOSIS — N99820 Postprocedural hemorrhage and hematoma of a genitourinary system organ or structure following a genitourinary system procedure: Secondary | ICD-10-CM | POA: Diagnosis present

## 2014-12-17 DIAGNOSIS — D62 Acute posthemorrhagic anemia: Secondary | ICD-10-CM | POA: Diagnosis not present

## 2014-12-17 DIAGNOSIS — R269 Unspecified abnormalities of gait and mobility: Secondary | ICD-10-CM | POA: Diagnosis present

## 2014-12-17 DIAGNOSIS — M545 Low back pain: Secondary | ICD-10-CM | POA: Diagnosis present

## 2014-12-17 DIAGNOSIS — M216X2 Other acquired deformities of left foot: Secondary | ICD-10-CM | POA: Diagnosis present

## 2014-12-17 DIAGNOSIS — R51 Headache: Secondary | ICD-10-CM | POA: Diagnosis present

## 2014-12-17 LAB — CBC WITH DIFFERENTIAL/PLATELET
BASOS PCT: 1 % (ref 0–1)
Basophils Absolute: 0 10*3/uL (ref 0.0–0.1)
Basophils Absolute: 0 10*3/uL (ref 0.0–0.1)
Basophils Relative: 1 % (ref 0–1)
EOS PCT: 3 % (ref 0–5)
Eosinophils Absolute: 0.1 10*3/uL (ref 0.0–0.7)
Eosinophils Absolute: 0.1 10*3/uL (ref 0.0–0.7)
Eosinophils Relative: 1 % (ref 0–5)
HCT: 30.4 % — ABNORMAL LOW (ref 36.0–46.0)
HCT: 27.6 % — ABNORMAL LOW (ref 36.0–46.0)
HEMOGLOBIN: 10.5 g/dL — AB (ref 12.0–15.0)
Hemoglobin: 9.4 g/dL — ABNORMAL LOW (ref 12.0–15.0)
LYMPHS PCT: 22 % (ref 12–46)
Lymphocytes Relative: 26 % (ref 12–46)
Lymphs Abs: 1 10*3/uL (ref 0.7–4.0)
Lymphs Abs: 1.1 10*3/uL (ref 0.7–4.0)
MCH: 29.3 pg (ref 26.0–34.0)
MCH: 29.2 pg (ref 26.0–34.0)
MCHC: 34.2 g/dL (ref 30.0–36.0)
MCHC: 34.1 g/dL (ref 30.0–36.0)
MCV: 85.6 fL (ref 78.0–100.0)
MCV: 85.7 fL (ref 78.0–100.0)
Monocytes Absolute: 0.4 10*3/uL (ref 0.1–1.0)
Monocytes Absolute: 0.5 10*3/uL (ref 0.1–1.0)
Monocytes Relative: 10 % (ref 3–12)
Monocytes Relative: 12 % (ref 3–12)
NEUTROS ABS: 2.9 10*3/uL (ref 1.7–7.7)
Neutro Abs: 2.4 10*3/uL (ref 1.7–7.7)
Neutrophils Relative %: 67 % (ref 43–77)
Neutrophils Relative %: 58 % (ref 43–77)
PLATELETS: 151 10*3/uL (ref 150–400)
Platelets: 160 10*3/uL (ref 150–400)
RBC: 3.55 MIL/uL — AB (ref 3.87–5.11)
RBC: 3.22 MIL/uL — ABNORMAL LOW (ref 3.87–5.11)
RDW: 13.1 % (ref 11.5–15.5)
RDW: 13.3 % (ref 11.5–15.5)
WBC: 4.3 10*3/uL (ref 4.0–10.5)
WBC: 4.1 10*3/uL (ref 4.0–10.5)

## 2014-12-17 MED ORDER — OXYCODONE HCL 5 MG PO TABS
5.0000 mg | ORAL_TABLET | ORAL | Status: DC | PRN
  Administered 2014-12-17 – 2014-12-19 (×8): 5 mg via ORAL
  Filled 2014-12-17 (×9): qty 1

## 2014-12-17 NOTE — Progress Notes (Signed)
Status post total abdominal hysterectomy with bilateral salpingectomy/left oophorectomy, collection of pelvic washings - POD 4/Post op pelvic hematoma - Readmision Day 2   Status post 2 units of packed red blood cells.   Subjective: Patient reports feeling much better even after just the first units of red blood cells.  Some back discomfort from lying in bed.  Getting out of bed helping.  Took some Dilaudid during night.  Had temp and received Tylenol. Hungry.   Objective: I have reviewed patient's vital signs, intake and output and labs. Tmax 101.5 - T now 100.1, BP 118/73, P92, RR 18 I/O - 873.3 cc/1600 cc WBC - 4.3 with normal differential, Hgb 10.5, Hct 30.4  General: alert, cooperative and  sunny disposition. Resp: clear to auscultation bilaterally Cardio: regular rate and rhythm, S1, S2 normal, no murmur, click, rub or gallop GI: soft, non-tender; bowel sounds normal; no masses,  no organomegaly and incision: dry, intact and  Mildly blood stained steristrips. No induration or ecchymoses. Extremities: PAS and Ted hose on.  Vaginal Bleeding: none  Assessment: s/p Status post total abdominal hysterectomy with bilateral salpingectomy/left oophorectomy, collection of pelvic washings - POD 4/Post op pelvic hematoma - Readmision Day 2   : progressing well, anemia and fever defervescing.  Status post 2 units of packed red blood cells.  I believe the source of the fever is the hematoma.   Plan: Advance diet Encourage ambulation Advance to PO medication Continue Cefotetan.  CBC this pm at 16:00.  Continue in hospital care. Care plan reviewed with patient.      Aundria Rud, MD  12/17/2014, 7:46 AM

## 2014-12-17 NOTE — Progress Notes (Signed)
Utilization Review completed.  

## 2014-12-17 NOTE — Progress Notes (Signed)
  Status post total abdominal hysterectomy with bilateral salpingectomy/left oophorectomy, collection of pelvic washings - POD 4/Post op pelvic hematoma - Readmision Day 2    Status post 2 units of packed red blood cells.   Subjective: Patient reports tolerating PO, + flatus, + BM and no problems voiding.   Voiding a lot.  Denies vaginal bleeding.  States a little headache.  Taking Oxycodone and Tylenol for pain.  Pain is mild. Walking in the halls.   Objective: I have reviewed patient's vital signs, intake and output and labs. T max 101.5, T now 98.4, BP 124/78, P 85, RR 18 UO - 1750 cc.  General: alert and cooperative Resp: clear to auscultation bilaterally Cardio: regular rate and rhythm, S1, S2 normal, no murmur, click, rub or gallop GI: soft, non-tender; bowel sounds normal; no masses,  no organomegaly and incision: dry, intact and dried blood on steristrips (no change or increase).  Assessment: s/p  Status post total abdominal hysterectomy with bilateral salpingectomy/left oophorectomy, collection of pelvic washings - POD 4/Post op pelvic hematoma - Readmision Day 2    Status post 2 units of packed red blood cells. Hgb drop from 10.5 to 9.4.  Hemodynamically stable.  This Hgb level is acceptable.  Fever resolving.  On Cefotetan day number one.   Plan: Check CBC with diff in am.   Will continue Cefotetan.  NPO after midnight except meds with sip of water.  If Hgb/Hct has significant drop tomorrow, will contact Interventional Radiology and determine if angiogram/embolization is reasonable or surgical intervention with exploratory laparotomy needed. Plan discussed with patient and her brother.  They are in agreement.   LOS: 0 days    Aundria Rud, MD  12/17/2014, 5:22 PM

## 2014-12-18 LAB — CBC WITH DIFFERENTIAL/PLATELET
Basophils Absolute: 0 10*3/uL (ref 0.0–0.1)
Basophils Relative: 0 % (ref 0–1)
Eosinophils Absolute: 0.2 10*3/uL (ref 0.0–0.7)
Eosinophils Relative: 3 % (ref 0–5)
HEMATOCRIT: 31.2 % — AB (ref 36.0–46.0)
HEMOGLOBIN: 10.7 g/dL — AB (ref 12.0–15.0)
LYMPHS ABS: 1.1 10*3/uL (ref 0.7–4.0)
LYMPHS PCT: 22 % (ref 12–46)
MCH: 29.4 pg (ref 26.0–34.0)
MCHC: 34.3 g/dL (ref 30.0–36.0)
MCV: 85.7 fL (ref 78.0–100.0)
MONOS PCT: 8 % (ref 3–12)
Monocytes Absolute: 0.4 10*3/uL (ref 0.1–1.0)
NEUTROS ABS: 3.4 10*3/uL (ref 1.7–7.7)
NEUTROS PCT: 67 % (ref 43–77)
Platelets: 180 10*3/uL (ref 150–400)
RBC: 3.64 MIL/uL — AB (ref 3.87–5.11)
RDW: 13.4 % (ref 11.5–15.5)
WBC: 5.1 10*3/uL (ref 4.0–10.5)

## 2014-12-18 LAB — TYPE AND SCREEN
ABO/RH(D): A POS
Antibody Screen: NEGATIVE
Unit division: 0
Unit division: 0

## 2014-12-18 MED ORDER — FERROUS SULFATE 325 (65 FE) MG PO TABS
325.0000 mg | ORAL_TABLET | Freq: Three times a day (TID) | ORAL | Status: DC
  Administered 2014-12-18 – 2014-12-19 (×3): 325 mg via ORAL
  Filled 2014-12-18 (×3): qty 1

## 2014-12-18 NOTE — Progress Notes (Signed)
Status post total abdominal hysterectomy with bilateral salpingectomy/left oophorectomy, collection of pelvic washings - POD 5/Post op pelvic hematoma - Readmision Day 3  Status post 2 units of packed red blood cells.  Subjective: Patient reports tolerating PO, + flatus, + BM and no problems voiding.   Denies vaginal bleeding. Good pain control with Oxycodone and Tylenol when takes them.  Skipped a dose of Oxycodone and felt a little more incisional pain.  Walking in the halls.  No headache.  Feels really good.   Objective: I have reviewed patient's vital signs, intake and output and labs. T 99.4, BP 126/78, P 77, RR 20 I/0 - 4212.1 cc/4300 cc Hgb 10.7, Hct 31.2, WBC 5.1 - normal differential.  General: alert and cooperative Resp: clear to auscultation bilaterally Cardio: regular rate and rhythm, S1, S2 normal, no murmur, click, rub or gallop GI: soft, non-tender; bowel sounds normal; no masses,  no organomegaly and incision: dry, intact and Dried blood on steristrips.  No active bleeding.   Nontender. Extremities:  Ted hose on. Vaginal Bleeding: none  Assessment:  Status post total abdominal hysterectomy with bilateral salpingectomy/left oophorectomy, collection of pelvic washings - POD 5/Post op pelvic hematoma - Readmision Day 3  Status post 2 units of packed red blood cells.  Hgb good.  I am not concerned about further intraperitoneal bleeding.  Low grade temp.  Fever curve trending downward. On Cefotetan day number 2. Normal WBC.   Plan: Advance diet Will have patient restart FeSO4 325 mg po tid with meals.   IVF to [facility].  Continue Cefotetan.  In hospital care until afebrile for 48 hours due to large pelvic hematoma. Plan for CBC tomorrow and discharge if continues to be stable and afebrile.  Patient in agreement with care plan.  Care reviewed with patient including her surgical admission and discharge, office evaluation, and this current hospital stay.   She adds that  she was feeling really well until when she was in the [facility] Admissions on 12/16/14 and was starting to feels the effects of her anemia.    Aundria Rud, MD  12/18/2014, 9:24 AM

## 2014-12-19 ENCOUNTER — Ambulatory Visit: Payer: BLUE CROSS/BLUE SHIELD | Admitting: Obstetrics and Gynecology

## 2014-12-19 ENCOUNTER — Telehealth: Payer: Self-pay | Admitting: Obstetrics and Gynecology

## 2014-12-19 LAB — CBC WITH DIFFERENTIAL/PLATELET
BASOS PCT: 0 % (ref 0–1)
Basophils Absolute: 0 10*3/uL (ref 0.0–0.1)
Eosinophils Absolute: 0.1 10*3/uL (ref 0.0–0.7)
Eosinophils Relative: 3 % (ref 0–5)
HCT: 30.7 % — ABNORMAL LOW (ref 36.0–46.0)
Hemoglobin: 10.5 g/dL — ABNORMAL LOW (ref 12.0–15.0)
Lymphocytes Relative: 18 % (ref 12–46)
Lymphs Abs: 0.9 10*3/uL (ref 0.7–4.0)
MCH: 29.3 pg (ref 26.0–34.0)
MCHC: 34.2 g/dL (ref 30.0–36.0)
MCV: 85.8 fL (ref 78.0–100.0)
MONO ABS: 0.6 10*3/uL (ref 0.1–1.0)
Monocytes Relative: 11 % (ref 3–12)
NEUTROS ABS: 3.3 10*3/uL (ref 1.7–7.7)
NEUTROS PCT: 68 % (ref 43–77)
Platelets: 190 10*3/uL (ref 150–400)
RBC: 3.58 MIL/uL — ABNORMAL LOW (ref 3.87–5.11)
RDW: 13.4 % (ref 11.5–15.5)
WBC: 4.9 10*3/uL (ref 4.0–10.5)

## 2014-12-19 MED ORDER — CIPROFLOXACIN HCL 500 MG PO TABS: ORAL_TABLET | ORAL | Status: DC

## 2014-12-19 MED ORDER — FLUCONAZOLE 150 MG PO TABS: 150.0000 mg | ORAL_TABLET | Freq: Once | ORAL | Status: DC

## 2014-12-19 NOTE — Telephone Encounter (Signed)
Patient called back and rescheduled her 1 week post op for Friday, 12/23/14. Her appointment was originally for today, 12/19/14. Please advise if this is not soon enough.

## 2014-12-19 NOTE — Discharge Instructions (Signed)
Please resume your previous post operative instructions.  Please call for fever!

## 2014-12-19 NOTE — Telephone Encounter (Signed)
Appointment for this Friday is fine.  Thank you.

## 2014-12-19 NOTE — Telephone Encounter (Signed)
Pt in hospital - appt cancelled. Call to reschedule at later date.

## 2014-12-19 NOTE — Progress Notes (Signed)
Status post total abdominal hysterectomy with bilateral salpingectomy/left oophorectomy, collection of pelvic washings - POD 6/Post op pelvic hematoma - Readmision Day 4  Status post 2 units of packed red blood cells.   Subjective: Patient reports doing well.  Some incisional pain.   No vaginal bleeding.  No concerns.  States fever was documented after she had a shower yesterday.  Taking Oxycodone only.  Ready for discharge home.    Objective: I have reviewed patient's vital signs, intake and output and labs. Tmax 100.3 yesterday at 11:41, T now 98.4, BP 106/68, P 68, RR 18 I/O - 5451.01/5849 cc Hgb 10.5, WBC 4.9 - normal differential  General: alert and cooperative Resp: clear to auscultation bilaterally Cardio: regular rate and rhythm, S1, S2 normal, no murmur, click, rub or gallop GI: soft, non-tender; bowel sounds normal; no masses,  no organomegaly and incision: dry, intact and no drainage present Vaginal Bleeding: none  Assessment:  Status post total abdominal hysterectomy with bilateral salpingectomy/left oophorectomy, collection of pelvic washings - POD 6/Post op pelvic hematoma - Readmision Day 4  Status post 2 units of packed red blood cells. Stable hemoglobin.  Low grade fever yesterday am.  Cefotetan day number 3. Ready for discharge.   Plan:  Discharge home.  Resume Percocet and Motrin prn.  Ciprofloxacin 500 mg po bid for one week.  Follow up in office within one week.  Instructions and precautions given.   Aundria Rud, MD  12/19/2014, 7:23 AM

## 2014-12-19 NOTE — Discharge Summary (Signed)
Physician Discharge Summary  Patient ID: Samantha Schmitt MRN: [id number] DOB/AGE: 05-08-1971 44 y.o.  Admit date: 12/16/2014 Discharge date: 12/19/2014  Admission Diagnoses: 1.  Status post total abdominal hysterectomy with bilateral salpingectomy, left oophorectomy, and collection of pelvic washings - POD 3. 2.  Postop anemia.  Admission hemoglobin 7.5. 3.  Pelvic hematoma.  Discharge Diagnoses: 1.  Status post total abdominal hysterectomy with bilateral salpingectomy, left oophorectomy, and collection of pelvic washings - POD 6. 2.  Postop anemia.  Status post transfusion with 2 units packed red blood cells.  Discharge hemoglobin 10.5. 3.  Pelvic hematoma. 4.  Postop fever.  Treated with Cefotetan IV.  Normal WBCs.    Active Problems:   Anemia   Discharged Condition: good  Hospital Course:  Patient was admitted on 12/16/14 on post op day 3 following uneventful total abdominal hysterectomy with bilateral salpingectomy, left oophorectomy, and collection of pelvic washings performed for uterine fibroids and complex left ovarian cyst.  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  Post op hemoglobins for the patient were 10.1 on post op day one and 8.8 on post op day two.  Patient was discharged to home on post op day two on iron sulfate 325 mg po tid as she was clinically doing well and tolerating her anemia.  She was scheduled for a first post op visit on the day after discharge, 12/16/14.  She reported a slight headache and increased heartbeat.  An office capillary Hgb was 7.7.  The patient was sent to the [facility] at that time for a CT of the abdomen and pelvis which showed the pelvic hematoma, later estimated by Dr. Earle Gell of radiology to be approximately 500 cc. There were no signs of a vessel that could be embolized.  The patient had a Hgb of 7.5 and her WBC was 4.5 on her serum hospital CBC.  The patient was admitted for  transfusion of 2 units of packed red blood cells, Cefotetan IV abx prophylaxis, and IVF hydration.  Just at the time of the transfusion, the patient spiked a fever of 101.5 degrees Fahrenheit.  The etiology of the fever was attributed to the hematoma. The patient tolerated the transfusion well.  Her hemoglobin vacillated from 10.5 immediately post transfusion to 9.4 the evening of 5/7, 10.7 on 5/8, and then 10.5 on the day of discharge.  Her fever curve trended downward, and her WBC with differential remained normal.  She received Tylenol for fever, and Oxycodone for pain.  She demonstrated no sign of incisional infection or vaginal bleeding.  She had normal bowel movements and voided well during the hospitalization. The patient ambulated normally during the hospital readmission, and she received Ted hose and PAS stockings for DVT prophylaxis.  She tolerated a regular diet.  She was found to be in good condition and ready for discharge on 12/19/14.  She is discharged on Ciprofloxacin 500 mg po bid for one week for prophylaxis against pelvic abscess.     Consults: None  Significant Diagnostic Studies: labs: Hemoglobin upon admission 7.5 on 12/16/14  and hemoglobin upon discharge 10.5 on 12/19/14. CT scan of the abdomen and pelvis on 12/16/14 showing 500 cc blood clot in hysterectomy bed.  Small amount of blood in paracolic gutter.  No active arterial bleeding vessel noted.  Changes of the abdominal wall consistent with postoperative changes.   Treatments: IV hydration, antibiotics:  Cefotetan for 2.5 days and transfusion of 2 units  of packed red blood cells.   Discharge Exam: Blood pressure 106/68, pulse 68, temperature 98.4 F (36.9 C), temperature source Oral, resp. rate 18, height 5\' 6"  (1.676 m), weight 220 lb (99.791 kg), last menstrual period 08/18/2014, SpO2 100 %. General: alert and cooperative Resp: clear to auscultation bilaterally Cardio: regular rate and rhythm, S1, S2 normal, no murmur, click, rub  or gallop GI: soft, non-tender; bowel sounds normal; no masses, no organomegaly and incision: dry, intact and no drainage present Vaginal Bleeding: none  Disposition: 01-Home or Self Care Instructions and precautions are reviewed.  Call for fever, bleeding, incisional drainage or erythema, increasing pain, nausea/vomiting/diarrhea, dizziness/lightheadness, or any other concern. Follow up in office this week.      Medication List    STOP taking these medications        Echinacea 400 MG Caps     Fish Oil 1000 MG Caps     MSM PO     vitamin E 200 UNIT capsule      TAKE these medications        calcium carbonate 500 MG chewable tablet  Commonly known as:  TUMS - dosed in mg elemental calcium  Chew 1 tablet by mouth daily.     ciprofloxacin 500 MG tablet  Commonly known as:  CIPRO  Take twice a day for one week.     docusate sodium 100 MG capsule  Commonly known as:  COLACE  Take 1 capsule (100 mg total) by mouth daily.     ferrous sulfate 325 (65 FE) MG EC tablet  Take 1 tablet (325 mg total) by mouth 3 (three) times daily with meals.     fluconazole 150 MG tablet  Commonly known as:  DIFLUCAN  Take 1 tablet (150 mg total) by mouth once. Take one tablet.  Repeat in 48 hours if symptoms are not completely resolved.     ibuprofen 600 MG tablet  Commonly known as:  ADVIL,MOTRIN  Take 1 tablet (600 mg total) by mouth every 6 (six) hours as needed (mild pain).     multivitamin capsule  Take 1 capsule by mouth daily.     oxyCODONE-acetaminophen 5-325 MG per tablet  Commonly known as:  PERCOCET/ROXICET  Take 1-2 tablets by mouth every 4 (four) hours as needed for severe pain (moderate to severe pain (when tolerating fluids)).           Follow-up Information    Follow up with Darcel Bayley, MD In 3 days.   Specialty:  Obstetrics and Gynecology   Contact information:   [address] [place] [address] [phone]        Signed: Aundria Rud, MD  12/19/2014, 8:37 PM

## 2014-12-19 NOTE — Progress Notes (Signed)
Pt verbalizes understanding of d/c instructions, medications, follow up appts, when to seek medical attention and belongings policy. No questions at this time. IV was removed by NT. Pt has copy of d/c instructions, medications were called in to her pharmacy. Pt d/c to the main entrance accompanied by NT. Samantha Schmitt

## 2014-12-20 LAB — HEMOGLOBIN, FINGERSTICK: Hemoglobin, fingerstick: 7.7 g/dL — CL (ref 12.0–16.0)

## 2014-12-23 ENCOUNTER — Telehealth: Payer: Self-pay | Admitting: Obstetrics and Gynecology

## 2014-12-23 ENCOUNTER — Ambulatory Visit: Payer: BLUE CROSS/BLUE SHIELD | Admitting: Obstetrics and Gynecology

## 2014-12-23 NOTE — Telephone Encounter (Signed)
Called and spoke with patient to cancel her one week post op visit today with Dr. Quincy Simmonds due to provider is sick and will not be in the office. Patient reports she is "doing really well" and is aware we will give her a call back to reschedule.  Routing to [place] for assistance with rescheduling.

## 2014-12-23 NOTE — Telephone Encounter (Signed)
Patient rescheduled for Monday, 12/26/14, with Dr. Quincy Simmonds.

## 2014-12-23 NOTE — Telephone Encounter (Signed)
Patient appointment rescheduled to 12-26-14 at 1230.  Routing to provider for final review. Patient agreeable to disposition.  Will close encounter.

## 2014-12-26 ENCOUNTER — Ambulatory Visit (INDEPENDENT_AMBULATORY_CARE_PROVIDER_SITE_OTHER): Payer: BLUE CROSS/BLUE SHIELD | Admitting: Obstetrics and Gynecology

## 2014-12-26 ENCOUNTER — Encounter: Payer: Self-pay | Admitting: Obstetrics and Gynecology

## 2014-12-26 VITALS — BP 120/80 | HR 68 | Temp 98.5°F | Ht 66.0 in | Wt 212.0 lb

## 2014-12-26 DIAGNOSIS — D6489 Other specified anemias: Secondary | ICD-10-CM

## 2014-12-26 DIAGNOSIS — Z9889 Other specified postprocedural states: Secondary | ICD-10-CM

## 2014-12-26 LAB — CBC
HCT: 40.2 % (ref 36.0–46.0)
HEMOGLOBIN: 13.7 g/dL (ref 12.0–15.0)
MCH: 29.3 pg (ref 26.0–34.0)
MCHC: 34.1 g/dL (ref 30.0–36.0)
MCV: 86.1 fL (ref 78.0–100.0)
MPV: 8.3 fL — AB (ref 8.6–12.4)
Platelets: 432 10*3/uL — ABNORMAL HIGH (ref 150–400)
RBC: 4.67 MIL/uL (ref 3.87–5.11)
RDW: 13.9 % (ref 11.5–15.5)
WBC: 7.5 10*3/uL (ref 4.0–10.5)

## 2014-12-26 NOTE — Progress Notes (Signed)
GYNECOLOGY  VISIT   HPI: 44 y.o.   Single  Caucasian  female   G0P0 with Patient's last menstrual period was 08/18/2014.   here for  Post op from 12/13/14  HYSTERECTOMY ABDOMINAL N/A General        SALPINGO OOPHORECTOMY Left General   RIGHT SALPINGECTOMY Right  General          Status post transfusion of 2 units packed RBCs on 12/16/14 for Hgb 7.5. CT showed 500 cc blood in pelvis. Was treated with Cefotetan for prophylaxis of infection.  Just finished Ciprofloxacin 500 mg po bid for oral abx prophylaxis.   No vaginal bleeding.  No fevers.  Bladder and bowels functioning normal.   Feels great.   Taking iron sulfate 325 mg twice a day.   Hgb 13.8.  GYNECOLOGIC HISTORY: Patient's last menstrual period was 08/18/2014. Contraception:Hysterectomy  Menopausal hormone therapy: None Last mammogram: 09/12/14 BIRADS1:Neg Last pap smear: 09/18/11 Neg. HR HPV:Neg Hemoglobin: 13.8         OB History    Gravida Para Term Preterm AB TAB SAB Ectopic Multiple Living   0                  Patient Active Problem List   Diagnosis Date Noted  . Anemia 12/16/2014  . Status post hysterectomy 12/13/2014  . Status post total abdominal hysterectomy 12/13/2014  . Fibroids 09/23/2013  . 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 Foot pain 06/03/2012  . Pronation of feet 06/03/2012  . Gait abnormality 06/03/2012  . Swelling of foot joint 06/03/2012    Past Medical History  Diagnosis Date  . Lower back pain     L4-L5    Past Surgical History  Procedure Laterality Date  . Wisdom tooth extraction    . Abdominal hysterectomy N/A 12/13/2014    Procedure: HYSTERECTOMY ABDOMINAL;  Surgeon: Nunzio Cobbs, MD;  Location: [facility];  Service: Gynecology;  Laterality: N/A;  3 hours  . Salpingoophorectomy Left 12/13/2014    Procedure: SALPINGO OOPHORECTOMY;  Surgeon: Nunzio Cobbs, MD;  Location: [facility];  Service: Gynecology;  Laterality: Left;  . Unilateral salpingectomy  Right 12/13/2014    Procedure: RIGHT SALPINGECTOMY;  Surgeon: Nunzio Cobbs, MD;  Location: [facility];  Service: Gynecology;  Laterality: Right;    Current Outpatient Prescriptions  Medication Sig Dispense Refill  . calcium carbonate (TUMS - DOSED IN MG ELEMENTAL CALCIUM) 500 MG chewable tablet Chew 1 tablet by mouth daily.    Marland Kitchen docusate sodium (COLACE) 100 MG capsule Take 1 capsule (100 mg total) by mouth daily. 30 capsule 1  . ferrous sulfate 325 (65 FE) MG EC tablet Take 1 tablet (325 mg total) by mouth 3 (three) times daily with meals. 90 tablet 1  . ibuprofen (ADVIL,MOTRIN) 600 MG tablet Take 1 tablet (600 mg total) by mouth every 6 (six) hours as needed (mild pain). 30 tablet 0  . Multiple Vitamin (MULTIVITAMIN) capsule Take 1 capsule by mouth daily.    . fluconazole (DIFLUCAN) 150 MG tablet Take 1 tablet (150 mg total) by mouth once. Take one tablet.  Repeat in 48 hours if symptoms are not completely resolved. (Patient not taking: Reported on 12/26/2014) 2 tablet 0  . oxyCODONE-acetaminophen (PERCOCET/ROXICET) 5-325 MG per tablet Take 1-2 tablets by mouth every 4 (four) hours as needed for severe pain (moderate to severe pain (when tolerating fluids)). (Patient not taking: Reported on 12/26/2014) 30 tablet 0  No current facility-administered medications for this visit.     ALLERGIES: Minocycline  Family History  Problem Relation Age of Onset  . Hypertension Mother   . Thyroid disease Mother     History   Social History  . Marital Status: Single    Spouse Name: N/A  . Number of Children: N/A  . Years of Education: N/A   Occupational History  . Not on file.   Social History Main Topics  . Smoking status: Never Smoker   . Smokeless tobacco: Never Used  . Alcohol Use: No  . Drug Use: No  . Sexual Activity: No     Comment: TAH/LSO/Rsalpingectomy   Other Topics Concern  . Not on file   Social History Narrative    ROS:  Pertinent items are noted in  HPI.  PHYSICAL EXAMINATION:    BP 120/80 mmHg  Pulse 68  Temp(Src) 98.5 F (36.9 C) (Oral)  Ht 5\' 6"  (1.676 m)  Wt 212 lb (96.163 kg)  BMI 34.23 kg/m2  LMP 08/18/2014    General:  Good spirits, good coloration to skin.  Abdomen: incision clean, dry, intact.  Subcuticular sutures.  Steri strips on.  Abdomen soft, non-tender; bowel sounds normal; no masses,  no organomegaly   Pelvic: External genitalia:  no lesions              Urethra:  normal appearing urethra with no masses, tenderness or lesions              Bartholins and Skenes: normal                 Vagina: normal appearing vagina with normal color and discharge, no lesions              Cervix: absent               Bimanual Exam:  Uterus:  uterus absent              Adnexa: no mass, fullness, tenderness            Chaperone was present for exam.  ASSESSMENT  Status post TAH/bilateral salpingectomy, left oophorectomy.  Post op anemia with pelvic hematoma.  Status post 2 units of packed red blood cells.   PLAN  Check CBC now.  If Hgb is normal, can stop iron therapy.  Discussed activity level post op.  Return for 6 week post op visit.    An After Visit Summary was printed and given to the patient.  _15_____ minutes face to face time of which over 50% was spent in counseling.

## 2014-12-26 NOTE — Patient Instructions (Signed)
Please call for any fevers, vaginal bleeding, or increased pain.

## 2014-12-28 LAB — HEMOGLOBIN, FINGERSTICK: HEMOGLOBIN, FINGERSTICK: 13.8 g/dL (ref 12.0–16.0)

## 2015-01-13 ENCOUNTER — Telehealth: Payer: Self-pay | Admitting: Obstetrics and Gynecology

## 2015-01-13 NOTE — Telephone Encounter (Signed)
Spoke with patient. Had surgery on 12/13/2014 with Dr.Silva. Patient states that her father has been sick off and on for a while and feels it may be close to the "end." Patient is worried about traveling to be in [place] if something happens. States it will be an 8 1/2 hour drive, or 1 1/2 hour flight to [place] and then would drive to [place], or could take the train for 10 hours with the ability to walk around. Patient states "I was going to travel to stay with my brother before and she did not recommend such a long car ride." Patient would like to know what would be safe if she needs to go be with her family. Does have compression stockings she is able to wear with travel.

## 2015-01-13 NOTE — Telephone Encounter (Signed)
Phone call returned to patient.  Patient is feeling well and without any problems.  Ambulating well.  No headaches, palpitations, or shortness or breath.   OK travel by car or by plane.  Wear compression hose and hydrate well.  If drives, get out of car to walk for 10 - 15 minutes every 2 hours.   Support given.   Encounter closed.

## 2015-01-13 NOTE — Telephone Encounter (Signed)
Pt is post op (12/13/14) and is wanting to know if she able to travel due to a sick father. Pt is agreeable to a call back from triage.

## 2015-01-18 ENCOUNTER — Ambulatory Visit: Payer: BLUE CROSS/BLUE SHIELD | Admitting: Obstetrics and Gynecology

## 2015-02-08 ENCOUNTER — Ambulatory Visit (INDEPENDENT_AMBULATORY_CARE_PROVIDER_SITE_OTHER): Payer: BLUE CROSS/BLUE SHIELD | Admitting: Obstetrics and Gynecology

## 2015-02-08 ENCOUNTER — Encounter: Payer: Self-pay | Admitting: Obstetrics and Gynecology

## 2015-02-08 VITALS — BP 114/68 | HR 90 | Ht 66.0 in | Wt 220.4 lb

## 2015-02-08 DIAGNOSIS — Z90710 Acquired absence of both cervix and uterus: Secondary | ICD-10-CM

## 2015-02-08 NOTE — Progress Notes (Signed)
GYNECOLOGY  VISIT   HPI: 44 y.o.   Single  Caucasian  female   G0P0 with Patient's last menstrual period was 08/18/2014.   here for 6 week post op Status post TAH/LSO/right salpingectomy.  Some breast tenderness and bloating.  Not sure if she is ovulating.  Feeling good overall and with no problems.  Back at work.   Father passed away with multiple medical problems and had acute DVTs. Patient was able to travel to see him before he died.  GYNECOLOGIC HISTORY: Patient's last menstrual period was 08/18/2014. Contraception: Hysterectomy  Menopausal hormone therapy: none Last mammogram: 09/12/14 bi-rads b; bi-rads 1; neg Last pap smear: 09/18/11 wnl neg hr hpv        OB History    Gravida Para Term Preterm AB TAB SAB Ectopic Multiple Living   0                  Patient Active Problem List   Diagnosis Date Noted  . Anemia 12/16/2014  . Status post hysterectomy 12/13/2014  . Status post total abdominal hysterectomy 12/13/2014  . Fibroids 09/23/2013  . 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 Foot pain 06/03/2012  . Pronation of feet 06/03/2012  . Gait abnormality 06/03/2012  . Swelling of foot joint 06/03/2012    Past Medical History  Diagnosis Date  . Lower back pain     L4-L5    Past Surgical History  Procedure Laterality Date  . Wisdom tooth extraction    . Abdominal hysterectomy N/A 12/13/2014    Procedure: HYSTERECTOMY ABDOMINAL;  Surgeon: Nunzio Cobbs, MD;  Location: [facility];  Service: Gynecology;  Laterality: N/A;  3 hours  . Salpingoophorectomy Left 12/13/2014    Procedure: SALPINGO OOPHORECTOMY;  Surgeon: Nunzio Cobbs, MD;  Location: [facility];  Service: Gynecology;  Laterality: Left;  . Unilateral salpingectomy Right 12/13/2014    Procedure: RIGHT SALPINGECTOMY;  Surgeon: Nunzio Cobbs, MD;  Location: [facility];  Service: Gynecology;  Laterality: Right;    Current Outpatient Prescriptions  Medication Sig Dispense Refill  .  calcium carbonate (TUMS - DOSED IN MG ELEMENTAL CALCIUM) 500 MG chewable tablet Chew 1 tablet by mouth daily.    Marland Kitchen docusate sodium (COLACE) 100 MG capsule Take 1 capsule (100 mg total) by mouth daily. 30 capsule 1  . ferrous sulfate 325 (65 FE) MG EC tablet Take 1 tablet (325 mg total) by mouth 3 (three) times daily with meals. 90 tablet 1  . fluconazole (DIFLUCAN) 150 MG tablet Take 1 tablet (150 mg total) by mouth once. Take one tablet.  Repeat in 48 hours if symptoms are not completely resolved. (Patient not taking: Reported on 12/26/2014) 2 tablet 0  . ibuprofen (ADVIL,MOTRIN) 600 MG tablet Take 1 tablet (600 mg total) by mouth every 6 (six) hours as needed (mild pain). 30 tablet 0  . Multiple Vitamin (MULTIVITAMIN) capsule Take 1 capsule by mouth daily.    Marland Kitchen oxyCODONE-acetaminophen (PERCOCET/ROXICET) 5-325 MG per tablet Take 1-2 tablets by mouth every 4 (four) hours as needed for severe pain (moderate to severe pain (when tolerating fluids)). (Patient not taking: Reported on 12/26/2014) 30 tablet 0   No current facility-administered medications for this visit.     ALLERGIES: Minocycline  Family History  Problem Relation Age of Onset  . Hypertension Mother   . Thyroid disease Mother     History   Social History  . Marital Status: Single  Spouse Name: N/A  . Number of Children: N/A  . Years of Education: N/A   Occupational History  . Not on file.   Social History Main Topics  . Smoking status: Never Smoker   . Smokeless tobacco: Never Used  . Alcohol Use: No  . Drug Use: No  . Sexual Activity: No     Comment: TAH/LSO/Rsalpingectomy   Other Topics Concern  . Not on file   Social History Narrative    ROS:  Pertinent items are noted in HPI.  PHYSICAL EXAMINATION:    LMP 08/18/2014    General appearance: alert, cooperative and appears stated age Abdomen: Pfannenstiel incision intact, soft, non-tender; bowel sounds normal; no masses,  no organomegaly   Pelvic:  External genitalia:  no lesions              Urethra:  normal appearing urethra with no masses, tenderness or lesions              Bartholins and Skenes: normal                 Vagina: normal appearing vagina with normal color and discharge, no lesions.  Cuff intact.               Cervix: absent               Bimanual Exam:  Uterus:  uterus absent              Adnexa: normal adnexa and no mass, fullness, tenderness      Chaperone was present for exam.  ASSESSMENT  Status post TAH/Left salpingo-oophorectomy, right salpingectomy for fibroids and mucinous cystadenoma.  Doing well.   PLAN  Return to all normal activities.  Follow up for annual exam and prn.  An After Visit Summary was printed and given to the patient.

## 2015-03-17 ENCOUNTER — Ambulatory Visit: Payer: BC Managed Care – PPO | Admitting: Obstetrics and Gynecology

## 2015-06-09 ENCOUNTER — Ambulatory Visit (INDEPENDENT_AMBULATORY_CARE_PROVIDER_SITE_OTHER): Payer: BLUE CROSS/BLUE SHIELD | Admitting: Obstetrics and Gynecology

## 2015-06-09 ENCOUNTER — Encounter: Payer: Self-pay | Admitting: Obstetrics and Gynecology

## 2015-06-09 DIAGNOSIS — Z0000 Encounter for general adult medical examination without abnormal findings: Secondary | ICD-10-CM | POA: Diagnosis not present

## 2015-06-09 DIAGNOSIS — Z01419 Encounter for gynecological examination (general) (routine) without abnormal findings: Secondary | ICD-10-CM

## 2015-06-09 LAB — POCT URINALYSIS DIPSTICK
BILIRUBIN UA: NEGATIVE
Glucose, UA: NEGATIVE
Ketones, UA: NEGATIVE
LEUKOCYTES UA: NEGATIVE
NITRITE UA: NEGATIVE
PH UA: 5
Protein, UA: NEGATIVE
RBC UA: NEGATIVE
Urobilinogen, UA: NEGATIVE

## 2015-06-09 NOTE — Progress Notes (Signed)
Patient ID: Samantha Schmitt, female   DOB: 04-Aug-1971, 44 y.o.   MRN: [id number] 44 y.o. G0P0 Single Caucasian female here for annual exam.    Occasional hot flashes.   Back to exercise since hysterectomy.   Dad passed in June this year.   Will see PCP for flu vaccine and labs.   PCP: Sadie Haber @ [facility]  Patient's last menstrual period was 08/18/2014.          Sexually active: No. female The current method of family planning is status post hysterectomy.    Exercising: Yes.    walking, yoga and weights. Smoker:  no  Health Maintenance: Pap:  09-18-11 Neg:Neg HR HPV History of abnormal Pap:  no MMG:  09-12-14 Density Cat.B/Neg/BiRads1:[facility]. Colonoscopy:  n/a BMD:   n/a  Result  n/a TDaP:  2011 Screening Labs:  Hb today: PCP, Urine today: Neg   reports that she has never smoked. She has never used smokeless tobacco. She reports that she does not drink alcohol or use illicit drugs.  Past Medical History  Diagnosis Date  . Lower back pain     L4-L5    Past Surgical History  Procedure Laterality Date  . Wisdom tooth extraction    . Abdominal hysterectomy N/A 12/13/2014    Procedure: HYSTERECTOMY ABDOMINAL;  Surgeon: Nunzio Cobbs, MD;  Location: [facility];  Service: Gynecology;  Laterality: N/A;  3 hours  . Salpingoophorectomy Left 12/13/2014    Procedure: SALPINGO OOPHORECTOMY;  Surgeon: Nunzio Cobbs, MD;  Location: [facility];  Service: Gynecology;  Laterality: Left;  . Unilateral salpingectomy Right 12/13/2014    Procedure: RIGHT SALPINGECTOMY;  Surgeon: Nunzio Cobbs, MD;  Location: [facility];  Service: Gynecology;  Laterality: Right;    Current Outpatient Prescriptions  Medication Sig Dispense Refill  . calcium carbonate (TUMS - DOSED IN MG ELEMENTAL CALCIUM) 500 MG chewable tablet Chew 1 tablet by mouth daily.    . Echinacea 400 MG CAPS Take 1 capsule by mouth as needed (prn during allergy seasons).    . Multiple Vitamin (MULTIVITAMIN)  capsule Take 1 capsule by mouth daily.    . Omega-3 Fatty Acids (FISH OIL PO) Take by mouth.    Marland Kitchen VITAMIN E PO Take by mouth.     No current facility-administered medications for this visit.    Family History  Problem Relation Age of Onset  . Hypertension Mother   . Thyroid disease Mother   . Other Father 31    Dec--rare form of TB    ROS:  Pertinent items are noted in HPI.  Otherwise, a comprehensive ROS was negative.  Exam:   LMP 08/18/2014    General appearance: alert, cooperative and appears stated age Head: Normocephalic, without obvious abnormality, atraumatic Neck: no adenopathy, supple, symmetrical, trachea midline and thyroid normal to inspection and palpation Lungs: clear to auscultation bilaterally Breasts: normal appearance, no masses or tenderness, Inspection negative, No nipple retraction or dimpling, No nipple discharge or bleeding, No axillary or supraclavicular adenopathy Heart: regular rate and rhythm Abdomen: Pfannenstiel incision, soft, non-tender; bowel sounds normal; no masses,  no organomegaly Extremities: extremities normal, atraumatic, no cyanosis or edema Skin: Skin color, texture, turgor normal. No rashes or lesions Lymph nodes: Cervical, supraclavicular, and axillary nodes normal. No abnormal inguinal nodes palpated Neurologic: Grossly normal  Pelvic: External genitalia:  no lesions              Urethra:  normal appearing urethra  with no masses, tenderness or lesions              Bartholins and Skenes: normal                 Vagina: normal appearing vagina with normal color and discharge, no lesions              Cervix: absent              Pap taken: No. Bimanual Exam:  Uterus:  uterus absent              Adnexa: no mass, fullness, tenderness              Rectovaginal: Yes.  .  Confirms.              Anus:  normal sphincter tone, no lesions  Chaperone was present for exam.  Assessment:   Well woman visit with normal exam. Status post  TAH/bilateral salpingectomy and left oophorectomy.   Plan: Yearly mammogram recommended after age 27.  Recommended self breast exam.  Pap and HR HPV as above. Recommendations for Calcium, Vitamin D, regular exercise program including cardiovascular and weight bearing exercise. Labs performed.  No..    Refills given on medications.  No..    Follow up annually and prn.      After visit summary provided.

## 2015-06-09 NOTE — Patient Instructions (Signed)

## 2015-09-08 ENCOUNTER — Other Ambulatory Visit: Payer: Self-pay

## 2015-09-08 DIAGNOSIS — Z1231 Encounter for screening mammogram for malignant neoplasm of breast: Secondary | ICD-10-CM

## 2015-09-26 ENCOUNTER — Ambulatory Visit
Admission: RE | Admit: 2015-09-26 | Discharge: 2015-09-26 | Disposition: A | Discharge status: Home / Self Care | Payer: BLUE CROSS/BLUE SHIELD | Source: Ambulatory Visit

## 2015-09-26 DIAGNOSIS — Z1231 Encounter for screening mammogram for malignant neoplasm of breast: Secondary | ICD-10-CM

## 2015-09-28 ENCOUNTER — Other Ambulatory Visit: Payer: Self-pay | Admitting: Obstetrics and Gynecology

## 2015-09-28 DIAGNOSIS — R928 Other abnormal and inconclusive findings on diagnostic imaging of breast: Secondary | ICD-10-CM

## 2015-10-04 ENCOUNTER — Ambulatory Visit
Admission: RE | Admit: 2015-10-04 | Discharge: 2015-10-04 | Disposition: A | Discharge status: Home / Self Care | Payer: BLUE CROSS/BLUE SHIELD | Source: Ambulatory Visit | Attending: Obstetrics and Gynecology | Admitting: Obstetrics and Gynecology

## 2015-10-04 DIAGNOSIS — R928 Other abnormal and inconclusive findings on diagnostic imaging of breast: Secondary | ICD-10-CM

## 2016-06-20 NOTE — Progress Notes (Signed)
45 y.o. G0P0 Single Caucasian female here for annual exam.    Traveling a lot to see family.   Watching [employer].  Checks at [employer].   Lost weight - 20 pounds.  Exercising.   Taking MVI, Tums, vit E, fish oil.  PCP: Theadore Nan   Patient's last menstrual period was 08/18/2014.           Sexually active: No.  The current method of family planning is status post hysterectomy.    Exercising: Yes.    Walking, weights Smoker:  no  Health Maintenance: Pap:  09-18-11 Neg:Neg HR HPV History of abnormal Pap:  no MMG:  09-26-15 3D/Density B/Rt.Br.possible asymmetry--warrants further evaluation,Rt .Br.Neg;10-04-15 3D Rt.Diag.mmg  Is Neg/BiRads1:[facility] TDaP:  2011 HIV: Done about 20 years ago Hep C: No Screening Labs:  Hb today: PCP, Urine today: PCP   reports that she has never smoked. She has never used smokeless tobacco. She reports that she does not drink alcohol or use drugs.  Past Medical History:  Diagnosis Date  . Lower back pain    L4-L5    Past Surgical History:  Procedure Laterality Date  . ABDOMINAL HYSTERECTOMY N/A 12/13/2014   Procedure: HYSTERECTOMY ABDOMINAL;  Surgeon: Nunzio Cobbs, MD;  Location: [facility];  Service: Gynecology;  Laterality: N/A;  3 hours  . SALPINGOOPHORECTOMY Left 12/13/2014   Procedure: SALPINGO OOPHORECTOMY;  Surgeon: Nunzio Cobbs, MD;  Location: [facility];  Service: Gynecology;  Laterality: Left;  . UNILATERAL SALPINGECTOMY Right 12/13/2014   Procedure: RIGHT SALPINGECTOMY;  Surgeon: Nunzio Cobbs, MD;  Location: [facility];  Service: Gynecology;  Laterality: Right;  . WISDOM TOOTH EXTRACTION      Current Outpatient Prescriptions  Medication Sig Dispense Refill  . calcium carbonate (TUMS - DOSED IN MG ELEMENTAL CALCIUM) 500 MG chewable tablet Chew 1 tablet by mouth daily.    . Echinacea 400 MG CAPS Take 1 capsule by mouth as needed (prn during allergy seasons).    . Multiple Vitamin (MULTIVITAMIN) capsule Take 1  capsule by mouth daily.    . Omega-3 Fatty Acids (FISH OIL PO) Take by mouth.    Marland Kitchen VITAMIN E PO Take by mouth.     No current facility-administered medications for this visit.     Family History  Problem Relation Age of Onset  . Hypertension Mother   . Thyroid disease Mother   . Other Father 86    Dec--rare form of TB    ROS:  Pertinent items are noted in HPI.  Otherwise, a comprehensive ROS was negative.  Exam:   BP 120/90 (BP Location: Right Arm, Patient Position: Sitting, Cuff Size: Large)   Pulse 76   Resp 14   Ht 5' 5.75" (1.67 m)   Wt 210 lb (95.3 kg)   LMP 08/18/2014   BMI 34.15 kg/m     General appearance: alert, cooperative and appears stated age Head: Normocephalic, without obvious abnormality, atraumatic Neck: no adenopathy, supple, symmetrical, trachea midline and thyroid normal to inspection and palpation Lungs: clear to auscultation bilaterally Breasts: normal appearance, no masses or tenderness, No nipple retraction or dimpling, No nipple discharge or bleeding, No axillary or supraclavicular adenopathy on left.  Right breast with two 5 mm masses at 10:00 position, no nodes, retractions, nipple discharge.   Heart: regular rate and rhythm Abdomen: soft, non-tender; no masses, no organomegaly Extremities: extremities normal, atraumatic, no cyanosis or edema Skin: Skin color, texture, turgor normal. No rashes  or lesions Lymph nodes: Cervical, supraclavicular, and axillary nodes normal. No abnormal inguinal nodes palpated Neurologic: Grossly normal  Pelvic: External genitalia:  no lesions              Urethra:  normal appearing urethra with no masses, tenderness or lesions              Bartholins and Skenes: normal                 Vagina: normal appearing vagina with normal color and discharge, no lesions              Cervix:  Absent.               Pap taken: No. Bimanual Exam:  Uterus:   Absent.              Adnexa: no mass, fullness, tenderness               Rectal exam: Yes.  .  Confirms.              Anus:  normal sphincter tone, no lesions  Chaperone was present for exam.  Assessment:   Well woman visit with normal exam. Status post TAH for uterine fibroids.  Right breast masses.  Doing weight loss.   Plan: Yearly mammogram recommended after age 42.  Will proceed with dx right breast mammogram and ultrasound.  Recommended self breast exam.  Pap and HR HPV as above. Guidelines for Calcium, Vitamin D, regular exercise program including cardiovascular and weight bearing exercise.   Follow up annually and prn.   After visit summary provided.

## 2016-06-21 ENCOUNTER — Encounter: Payer: Self-pay | Admitting: Obstetrics and Gynecology

## 2016-06-21 ENCOUNTER — Other Ambulatory Visit: Payer: Self-pay | Admitting: Obstetrics and Gynecology

## 2016-06-21 ENCOUNTER — Ambulatory Visit (INDEPENDENT_AMBULATORY_CARE_PROVIDER_SITE_OTHER): Payer: BLUE CROSS/BLUE SHIELD | Admitting: Obstetrics and Gynecology

## 2016-06-21 VITALS — BP 120/90 | HR 76 | Resp 14 | Ht 65.75 in | Wt 210.0 lb

## 2016-06-21 DIAGNOSIS — Z01419 Encounter for gynecological examination (general) (routine) without abnormal findings: Secondary | ICD-10-CM

## 2016-06-21 DIAGNOSIS — N6310 Unspecified lump in the right breast, unspecified quadrant: Secondary | ICD-10-CM | POA: Diagnosis not present

## 2016-06-21 NOTE — Progress Notes (Signed)
Right breast diagnostic mammogram and ultrasound scheduled for 06-28-16 at 940 at [facility]. Patient agreeable to appointment date and time.

## 2016-06-21 NOTE — Patient Instructions (Signed)

## 2016-06-27 ENCOUNTER — Telehealth: Payer: Self-pay | Admitting: Obstetrics and Gynecology

## 2016-06-27 DIAGNOSIS — N6310 Unspecified lump in the right breast, unspecified quadrant: Secondary | ICD-10-CM

## 2016-06-27 NOTE — Telephone Encounter (Signed)
Patient has appointment for tomorrow morning for her Mammogram and ultrasound andthey need an order.   [facility] Fax # [phone]

## 2016-06-27 NOTE — Telephone Encounter (Signed)
Dr.Jertson, patient is scheduled for R breast diagnostic mammogram and ultrasound at the [facility] tomorrow for right breast with two 5 mm masses at 10:00 position. Orders placed under your name as Dr.Silva is out of the office and unable to sign electronic order. Please review and sign.

## 2016-06-28 ENCOUNTER — Ambulatory Visit
Admission: RE | Admit: 2016-06-28 | Discharge: 2016-06-28 | Disposition: A | Discharge status: Home / Self Care | Payer: BLUE CROSS/BLUE SHIELD | Source: Ambulatory Visit | Attending: Obstetrics and Gynecology | Admitting: Obstetrics and Gynecology

## 2016-06-28 ENCOUNTER — Other Ambulatory Visit: Payer: BLUE CROSS/BLUE SHIELD

## 2016-06-28 DIAGNOSIS — N6310 Unspecified lump in the right breast, unspecified quadrant: Secondary | ICD-10-CM

## 2016-07-24 NOTE — Progress Notes (Signed)
GYNECOLOGY  VISIT   HPI: 45 y.o.   Single  Caucasian  female   G0P0 with Patient's last menstrual period was 08/18/2014.   here for  6wk breast recheck.  At annual exam had right breast lumps.  Two 5 mm masses at the 10:00 position.  Dx right mammogram and [place] both normal on 06/28/16.  GYNECOLOGIC HISTORY: Patient's last menstrual period was 08/18/2014. Contraception:  hysterectomy Menopausal hormone therapy:  none Last mammogram:  09-26-15 3D/Density B/Rt.Br.possible asymmetry--warrants further evaluation,Rt .Br.Neg;10-04-15 3D Rt.Diag.mmg  Is Neg/BiRads1:[facility] 06-28-16 u/s rt breast category c density birads 1:neg Last pap smear:   09-18-11 neg HPV HR neg        OB History    Gravida Para Term Preterm AB Living   0             SAB TAB Ectopic Multiple Live Births                     Patient Active Problem List   Diagnosis Date Noted  . Anemia 12/16/2014  . Status post hysterectomy 12/13/2014  . Status post total abdominal hysterectomy 12/13/2014  . Fibroids 09/23/2013  . 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 Foot pain 06/03/2012  . Pronation of feet 06/03/2012  . Gait abnormality 06/03/2012  . Swelling of foot joint 06/03/2012    Past Medical History:  Diagnosis Date  . Lower back pain    L4-L5    Past Surgical History:  Procedure Laterality Date  . ABDOMINAL HYSTERECTOMY N/A 12/13/2014   Procedure: HYSTERECTOMY ABDOMINAL;  Surgeon: Nunzio Cobbs, MD;  Location: [facility];  Service: Gynecology;  Laterality: N/A;  3 hours  . SALPINGOOPHORECTOMY Left 12/13/2014   Procedure: SALPINGO OOPHORECTOMY;  Surgeon: Nunzio Cobbs, MD;  Location: [facility];  Service: Gynecology;  Laterality: Left;  . UNILATERAL SALPINGECTOMY Right 12/13/2014   Procedure: RIGHT SALPINGECTOMY;  Surgeon: Nunzio Cobbs, MD;  Location: [facility];  Service: Gynecology;  Laterality: Right;  . WISDOM TOOTH EXTRACTION      Current Outpatient Prescriptions   Medication Sig Dispense Refill  . calcium carbonate (TUMS - DOSED IN MG ELEMENTAL CALCIUM) 500 MG chewable tablet Chew 1 tablet by mouth daily.    . Echinacea 400 MG CAPS Take 1 capsule by mouth as needed (prn during allergy seasons).    . Multiple Vitamin (MULTIVITAMIN) capsule Take 1 capsule by mouth daily.    . Omega-3 Fatty Acids (FISH OIL PO) Take by mouth.    Marland Kitchen VITAMIN E PO Take by mouth.     No current facility-administered medications for this visit.      ALLERGIES: Minocycline  Family History  Problem Relation Age of Onset  . Hypertension Mother   . Thyroid disease Mother   . Other Father 22    Dec--rare form of TB    Social History   Social History  . Marital status: Single    Spouse name: N/A  . Number of children: N/A  . Years of education: N/A   Occupational History  . Not on file.   Social History Main Topics  . Smoking status: Never Smoker  . Smokeless tobacco: Never Used  . Alcohol use No  . Drug use: No  . Sexual activity: No     Comment: TAH/LSO/Rsalpingectomy   Other Topics Concern  . Not on file   Social History Narrative  . No narrative on file  ROS:  Pertinent items are noted in HPI.  PHYSICAL EXAMINATION:    BP 122/84 (BP Location: Left Arm, Patient Position: Sitting, Cuff Size: Large)   Pulse 78   Resp 14   Ht 5' 5.75" (1.67 m)   Wt 213 lb 6.4 oz (96.8 kg)   LMP 08/18/2014   BMI 34.71 kg/m     General appearance: alert, cooperative and appears stated age   Breasts: normal appearance, no dominant masses or tenderness,  Normal fibroglandular tissue noted, No nipple retraction or dimpling, No nipple discharge or bleeding, No axillary or supraclavicular adenopathy  Chaperone was present for exam.  ASSESSMENT  Hx right breast mass.  Normal breast exam today.   PLAN  Return to routine mammogram screening.  Follow up for annual exam and prn.     An After Visit Summary was printed and given to the patient.  ___15___  minutes face to face time of which over 50% was spent in counseling.

## 2016-07-26 ENCOUNTER — Encounter: Payer: Self-pay | Admitting: Obstetrics and Gynecology

## 2016-07-26 ENCOUNTER — Ambulatory Visit (INDEPENDENT_AMBULATORY_CARE_PROVIDER_SITE_OTHER): Payer: BLUE CROSS/BLUE SHIELD | Admitting: Obstetrics and Gynecology

## 2016-07-26 VITALS — BP 122/84 | HR 78 | Resp 14 | Ht 65.75 in | Wt 213.4 lb

## 2016-07-26 DIAGNOSIS — N6310 Unspecified lump in the right breast, unspecified quadrant: Secondary | ICD-10-CM

## 2016-09-11 ENCOUNTER — Other Ambulatory Visit: Payer: Self-pay | Admitting: Obstetrics and Gynecology

## 2016-09-11 DIAGNOSIS — Z1231 Encounter for screening mammogram for malignant neoplasm of breast: Secondary | ICD-10-CM

## 2016-09-12 DIAGNOSIS — M9903 Segmental and somatic dysfunction of lumbar region: Secondary | ICD-10-CM | POA: Diagnosis not present

## 2016-09-12 DIAGNOSIS — M9901 Segmental and somatic dysfunction of cervical region: Secondary | ICD-10-CM | POA: Diagnosis not present

## 2016-09-12 DIAGNOSIS — M9902 Segmental and somatic dysfunction of thoracic region: Secondary | ICD-10-CM | POA: Diagnosis not present

## 2016-09-12 DIAGNOSIS — M5430 Sciatica, unspecified side: Secondary | ICD-10-CM | POA: Diagnosis not present

## 2016-10-10 DIAGNOSIS — M9903 Segmental and somatic dysfunction of lumbar region: Secondary | ICD-10-CM | POA: Diagnosis not present

## 2016-10-10 DIAGNOSIS — M9901 Segmental and somatic dysfunction of cervical region: Secondary | ICD-10-CM | POA: Diagnosis not present

## 2016-10-10 DIAGNOSIS — M5430 Sciatica, unspecified side: Secondary | ICD-10-CM | POA: Diagnosis not present

## 2016-10-10 DIAGNOSIS — M9902 Segmental and somatic dysfunction of thoracic region: Secondary | ICD-10-CM | POA: Diagnosis not present

## 2016-10-11 ENCOUNTER — Ambulatory Visit
Admission: RE | Admit: 2016-10-11 | Discharge: 2016-10-11 | Disposition: A | Discharge status: Home / Self Care | Payer: BLUE CROSS/BLUE SHIELD | Source: Ambulatory Visit | Attending: Obstetrics and Gynecology | Admitting: Obstetrics and Gynecology

## 2016-10-11 DIAGNOSIS — Z1231 Encounter for screening mammogram for malignant neoplasm of breast: Secondary | ICD-10-CM

## 2016-11-07 DIAGNOSIS — M9902 Segmental and somatic dysfunction of thoracic region: Secondary | ICD-10-CM | POA: Diagnosis not present

## 2016-11-07 DIAGNOSIS — M9903 Segmental and somatic dysfunction of lumbar region: Secondary | ICD-10-CM | POA: Diagnosis not present

## 2016-11-07 DIAGNOSIS — M5430 Sciatica, unspecified side: Secondary | ICD-10-CM | POA: Diagnosis not present

## 2016-11-07 DIAGNOSIS — M9901 Segmental and somatic dysfunction of cervical region: Secondary | ICD-10-CM | POA: Diagnosis not present

## 2016-11-27 DIAGNOSIS — H40033 Anatomical narrow angle, bilateral: Secondary | ICD-10-CM | POA: Diagnosis not present

## 2016-11-27 DIAGNOSIS — H04123 Dry eye syndrome of bilateral lacrimal glands: Secondary | ICD-10-CM | POA: Diagnosis not present

## 2016-12-04 DIAGNOSIS — H04123 Dry eye syndrome of bilateral lacrimal glands: Secondary | ICD-10-CM | POA: Diagnosis not present

## 2016-12-05 DIAGNOSIS — M9902 Segmental and somatic dysfunction of thoracic region: Secondary | ICD-10-CM | POA: Diagnosis not present

## 2016-12-05 DIAGNOSIS — M5137 Other intervertebral disc degeneration, lumbosacral region: Secondary | ICD-10-CM | POA: Diagnosis not present

## 2016-12-05 DIAGNOSIS — M9903 Segmental and somatic dysfunction of lumbar region: Secondary | ICD-10-CM | POA: Diagnosis not present

## 2016-12-05 DIAGNOSIS — M9901 Segmental and somatic dysfunction of cervical region: Secondary | ICD-10-CM | POA: Diagnosis not present

## 2017-01-02 DIAGNOSIS — M62830 Muscle spasm of back: Secondary | ICD-10-CM | POA: Diagnosis not present

## 2017-01-02 DIAGNOSIS — M9903 Segmental and somatic dysfunction of lumbar region: Secondary | ICD-10-CM | POA: Diagnosis not present

## 2017-01-02 DIAGNOSIS — M9901 Segmental and somatic dysfunction of cervical region: Secondary | ICD-10-CM | POA: Diagnosis not present

## 2017-01-02 DIAGNOSIS — M9902 Segmental and somatic dysfunction of thoracic region: Secondary | ICD-10-CM | POA: Diagnosis not present

## 2017-01-30 DIAGNOSIS — M9903 Segmental and somatic dysfunction of lumbar region: Secondary | ICD-10-CM | POA: Diagnosis not present

## 2017-01-30 DIAGNOSIS — M62830 Muscle spasm of back: Secondary | ICD-10-CM | POA: Diagnosis not present

## 2017-01-30 DIAGNOSIS — M9901 Segmental and somatic dysfunction of cervical region: Secondary | ICD-10-CM | POA: Diagnosis not present

## 2017-01-30 DIAGNOSIS — M9902 Segmental and somatic dysfunction of thoracic region: Secondary | ICD-10-CM | POA: Diagnosis not present

## 2017-02-27 DIAGNOSIS — M9903 Segmental and somatic dysfunction of lumbar region: Secondary | ICD-10-CM | POA: Diagnosis not present

## 2017-02-27 DIAGNOSIS — M9902 Segmental and somatic dysfunction of thoracic region: Secondary | ICD-10-CM | POA: Diagnosis not present

## 2017-02-27 DIAGNOSIS — M9901 Segmental and somatic dysfunction of cervical region: Secondary | ICD-10-CM | POA: Diagnosis not present

## 2017-02-27 DIAGNOSIS — M62830 Muscle spasm of back: Secondary | ICD-10-CM | POA: Diagnosis not present

## 2017-03-03 DIAGNOSIS — M9901 Segmental and somatic dysfunction of cervical region: Secondary | ICD-10-CM | POA: Diagnosis not present

## 2017-03-03 DIAGNOSIS — M62830 Muscle spasm of back: Secondary | ICD-10-CM | POA: Diagnosis not present

## 2017-03-03 DIAGNOSIS — M9902 Segmental and somatic dysfunction of thoracic region: Secondary | ICD-10-CM | POA: Diagnosis not present

## 2017-03-03 DIAGNOSIS — M9903 Segmental and somatic dysfunction of lumbar region: Secondary | ICD-10-CM | POA: Diagnosis not present

## 2017-03-06 DIAGNOSIS — M9901 Segmental and somatic dysfunction of cervical region: Secondary | ICD-10-CM | POA: Diagnosis not present

## 2017-03-06 DIAGNOSIS — M9902 Segmental and somatic dysfunction of thoracic region: Secondary | ICD-10-CM | POA: Diagnosis not present

## 2017-03-06 DIAGNOSIS — M9903 Segmental and somatic dysfunction of lumbar region: Secondary | ICD-10-CM | POA: Diagnosis not present

## 2017-03-06 DIAGNOSIS — M62830 Muscle spasm of back: Secondary | ICD-10-CM | POA: Diagnosis not present

## 2017-03-27 DIAGNOSIS — M9902 Segmental and somatic dysfunction of thoracic region: Secondary | ICD-10-CM | POA: Diagnosis not present

## 2017-03-27 DIAGNOSIS — M62830 Muscle spasm of back: Secondary | ICD-10-CM | POA: Diagnosis not present

## 2017-03-27 DIAGNOSIS — M9901 Segmental and somatic dysfunction of cervical region: Secondary | ICD-10-CM | POA: Diagnosis not present

## 2017-03-27 DIAGNOSIS — M9903 Segmental and somatic dysfunction of lumbar region: Secondary | ICD-10-CM | POA: Diagnosis not present

## 2017-04-23 DIAGNOSIS — M9903 Segmental and somatic dysfunction of lumbar region: Secondary | ICD-10-CM | POA: Diagnosis not present

## 2017-04-23 DIAGNOSIS — M62830 Muscle spasm of back: Secondary | ICD-10-CM | POA: Diagnosis not present

## 2017-04-23 DIAGNOSIS — M9901 Segmental and somatic dysfunction of cervical region: Secondary | ICD-10-CM | POA: Diagnosis not present

## 2017-04-23 DIAGNOSIS — M9902 Segmental and somatic dysfunction of thoracic region: Secondary | ICD-10-CM | POA: Diagnosis not present

## 2017-05-29 DIAGNOSIS — M62830 Muscle spasm of back: Secondary | ICD-10-CM | POA: Diagnosis not present

## 2017-05-29 DIAGNOSIS — M9902 Segmental and somatic dysfunction of thoracic region: Secondary | ICD-10-CM | POA: Diagnosis not present

## 2017-05-29 DIAGNOSIS — M9903 Segmental and somatic dysfunction of lumbar region: Secondary | ICD-10-CM | POA: Diagnosis not present

## 2017-05-29 DIAGNOSIS — M9901 Segmental and somatic dysfunction of cervical region: Secondary | ICD-10-CM | POA: Diagnosis not present

## 2017-06-26 DIAGNOSIS — M9903 Segmental and somatic dysfunction of lumbar region: Secondary | ICD-10-CM | POA: Diagnosis not present

## 2017-06-26 DIAGNOSIS — M9901 Segmental and somatic dysfunction of cervical region: Secondary | ICD-10-CM | POA: Diagnosis not present

## 2017-06-26 DIAGNOSIS — M62830 Muscle spasm of back: Secondary | ICD-10-CM | POA: Diagnosis not present

## 2017-06-26 DIAGNOSIS — M9902 Segmental and somatic dysfunction of thoracic region: Secondary | ICD-10-CM | POA: Diagnosis not present

## 2017-06-27 ENCOUNTER — Ambulatory Visit: Payer: BLUE CROSS/BLUE SHIELD | Admitting: Obstetrics and Gynecology

## 2017-07-08 ENCOUNTER — Telehealth: Payer: Self-pay | Admitting: Obstetrics and Gynecology

## 2017-07-08 NOTE — Telephone Encounter (Signed)
Left message for pt to reschedule Silva appt °

## 2017-07-22 ENCOUNTER — Ambulatory Visit: Payer: BLUE CROSS/BLUE SHIELD | Admitting: Obstetrics and Gynecology

## 2017-07-30 ENCOUNTER — Ambulatory Visit: Payer: BLUE CROSS/BLUE SHIELD | Admitting: Obstetrics and Gynecology

## 2017-07-31 DIAGNOSIS — M9902 Segmental and somatic dysfunction of thoracic region: Secondary | ICD-10-CM | POA: Diagnosis not present

## 2017-07-31 DIAGNOSIS — M9903 Segmental and somatic dysfunction of lumbar region: Secondary | ICD-10-CM | POA: Diagnosis not present

## 2017-07-31 DIAGNOSIS — M62830 Muscle spasm of back: Secondary | ICD-10-CM | POA: Diagnosis not present

## 2017-07-31 DIAGNOSIS — M9901 Segmental and somatic dysfunction of cervical region: Secondary | ICD-10-CM | POA: Diagnosis not present

## 2017-08-28 DIAGNOSIS — M62830 Muscle spasm of back: Secondary | ICD-10-CM | POA: Diagnosis not present

## 2017-08-28 DIAGNOSIS — M9902 Segmental and somatic dysfunction of thoracic region: Secondary | ICD-10-CM | POA: Diagnosis not present

## 2017-08-28 DIAGNOSIS — M9903 Segmental and somatic dysfunction of lumbar region: Secondary | ICD-10-CM | POA: Diagnosis not present

## 2017-08-28 DIAGNOSIS — M9901 Segmental and somatic dysfunction of cervical region: Secondary | ICD-10-CM | POA: Diagnosis not present

## 2017-09-25 DIAGNOSIS — M9901 Segmental and somatic dysfunction of cervical region: Secondary | ICD-10-CM | POA: Diagnosis not present

## 2017-09-25 DIAGNOSIS — M9903 Segmental and somatic dysfunction of lumbar region: Secondary | ICD-10-CM | POA: Diagnosis not present

## 2017-09-25 DIAGNOSIS — M62830 Muscle spasm of back: Secondary | ICD-10-CM | POA: Diagnosis not present

## 2017-09-25 DIAGNOSIS — M9902 Segmental and somatic dysfunction of thoracic region: Secondary | ICD-10-CM | POA: Diagnosis not present

## 2017-10-10 DIAGNOSIS — D2261 Melanocytic nevi of right upper limb, including shoulder: Secondary | ICD-10-CM | POA: Diagnosis not present

## 2017-10-10 DIAGNOSIS — L700 Acne vulgaris: Secondary | ICD-10-CM | POA: Diagnosis not present

## 2017-10-10 DIAGNOSIS — D225 Melanocytic nevi of trunk: Secondary | ICD-10-CM | POA: Diagnosis not present

## 2017-10-10 DIAGNOSIS — D2262 Melanocytic nevi of left upper limb, including shoulder: Secondary | ICD-10-CM | POA: Diagnosis not present

## 2017-10-10 DIAGNOSIS — B351 Tinea unguium: Secondary | ICD-10-CM | POA: Diagnosis not present

## 2017-10-30 DIAGNOSIS — M9902 Segmental and somatic dysfunction of thoracic region: Secondary | ICD-10-CM | POA: Diagnosis not present

## 2017-10-30 DIAGNOSIS — M62830 Muscle spasm of back: Secondary | ICD-10-CM | POA: Diagnosis not present

## 2017-10-30 DIAGNOSIS — M9903 Segmental and somatic dysfunction of lumbar region: Secondary | ICD-10-CM | POA: Diagnosis not present

## 2017-10-30 DIAGNOSIS — M9901 Segmental and somatic dysfunction of cervical region: Secondary | ICD-10-CM | POA: Diagnosis not present

## 2017-10-31 ENCOUNTER — Other Ambulatory Visit: Payer: Self-pay | Admitting: Obstetrics and Gynecology

## 2017-10-31 DIAGNOSIS — Z1231 Encounter for screening mammogram for malignant neoplasm of breast: Secondary | ICD-10-CM

## 2017-11-21 ENCOUNTER — Ambulatory Visit
Admission: RE | Admit: 2017-11-21 | Discharge: 2017-11-21 | Disposition: A | Discharge status: Home / Self Care | Payer: BLUE CROSS/BLUE SHIELD | Source: Ambulatory Visit | Attending: Obstetrics and Gynecology | Admitting: Obstetrics and Gynecology

## 2017-11-21 DIAGNOSIS — Z1231 Encounter for screening mammogram for malignant neoplasm of breast: Secondary | ICD-10-CM | POA: Diagnosis not present

## 2017-11-26 DIAGNOSIS — H04123 Dry eye syndrome of bilateral lacrimal glands: Secondary | ICD-10-CM | POA: Diagnosis not present

## 2017-11-26 DIAGNOSIS — H40033 Anatomical narrow angle, bilateral: Secondary | ICD-10-CM | POA: Diagnosis not present

## 2017-11-27 DIAGNOSIS — M9903 Segmental and somatic dysfunction of lumbar region: Secondary | ICD-10-CM | POA: Diagnosis not present

## 2017-11-27 DIAGNOSIS — M9901 Segmental and somatic dysfunction of cervical region: Secondary | ICD-10-CM | POA: Diagnosis not present

## 2017-11-27 DIAGNOSIS — M9902 Segmental and somatic dysfunction of thoracic region: Secondary | ICD-10-CM | POA: Diagnosis not present

## 2017-11-27 DIAGNOSIS — M62830 Muscle spasm of back: Secondary | ICD-10-CM | POA: Diagnosis not present

## 2017-12-05 DIAGNOSIS — G44219 Episodic tension-type headache, not intractable: Secondary | ICD-10-CM | POA: Diagnosis not present

## 2017-12-22 DIAGNOSIS — M62830 Muscle spasm of back: Secondary | ICD-10-CM | POA: Diagnosis not present

## 2017-12-22 DIAGNOSIS — M9903 Segmental and somatic dysfunction of lumbar region: Secondary | ICD-10-CM | POA: Diagnosis not present

## 2017-12-22 DIAGNOSIS — M9902 Segmental and somatic dysfunction of thoracic region: Secondary | ICD-10-CM | POA: Diagnosis not present

## 2017-12-22 DIAGNOSIS — M9901 Segmental and somatic dysfunction of cervical region: Secondary | ICD-10-CM | POA: Diagnosis not present

## 2018-01-22 DIAGNOSIS — M62830 Muscle spasm of back: Secondary | ICD-10-CM | POA: Diagnosis not present

## 2018-01-22 DIAGNOSIS — M9902 Segmental and somatic dysfunction of thoracic region: Secondary | ICD-10-CM | POA: Diagnosis not present

## 2018-01-22 DIAGNOSIS — M9903 Segmental and somatic dysfunction of lumbar region: Secondary | ICD-10-CM | POA: Diagnosis not present

## 2018-01-22 DIAGNOSIS — M9901 Segmental and somatic dysfunction of cervical region: Secondary | ICD-10-CM | POA: Diagnosis not present

## 2018-02-19 DIAGNOSIS — M9903 Segmental and somatic dysfunction of lumbar region: Secondary | ICD-10-CM | POA: Diagnosis not present

## 2018-02-19 DIAGNOSIS — M9902 Segmental and somatic dysfunction of thoracic region: Secondary | ICD-10-CM | POA: Diagnosis not present

## 2018-02-19 DIAGNOSIS — M62830 Muscle spasm of back: Secondary | ICD-10-CM | POA: Diagnosis not present

## 2018-02-19 DIAGNOSIS — M9901 Segmental and somatic dysfunction of cervical region: Secondary | ICD-10-CM | POA: Diagnosis not present

## 2018-03-18 DIAGNOSIS — M62830 Muscle spasm of back: Secondary | ICD-10-CM | POA: Diagnosis not present

## 2018-03-18 DIAGNOSIS — M9901 Segmental and somatic dysfunction of cervical region: Secondary | ICD-10-CM | POA: Diagnosis not present

## 2018-03-18 DIAGNOSIS — M9902 Segmental and somatic dysfunction of thoracic region: Secondary | ICD-10-CM | POA: Diagnosis not present

## 2018-03-18 DIAGNOSIS — M9903 Segmental and somatic dysfunction of lumbar region: Secondary | ICD-10-CM | POA: Diagnosis not present

## 2018-04-23 DIAGNOSIS — M9901 Segmental and somatic dysfunction of cervical region: Secondary | ICD-10-CM | POA: Diagnosis not present

## 2018-04-23 DIAGNOSIS — M9903 Segmental and somatic dysfunction of lumbar region: Secondary | ICD-10-CM | POA: Diagnosis not present

## 2018-04-23 DIAGNOSIS — M62830 Muscle spasm of back: Secondary | ICD-10-CM | POA: Diagnosis not present

## 2018-04-23 DIAGNOSIS — M9902 Segmental and somatic dysfunction of thoracic region: Secondary | ICD-10-CM | POA: Diagnosis not present

## 2018-05-07 DIAGNOSIS — M9902 Segmental and somatic dysfunction of thoracic region: Secondary | ICD-10-CM | POA: Diagnosis not present

## 2018-05-07 DIAGNOSIS — M9903 Segmental and somatic dysfunction of lumbar region: Secondary | ICD-10-CM | POA: Diagnosis not present

## 2018-05-07 DIAGNOSIS — M62830 Muscle spasm of back: Secondary | ICD-10-CM | POA: Diagnosis not present

## 2018-05-07 DIAGNOSIS — M9901 Segmental and somatic dysfunction of cervical region: Secondary | ICD-10-CM | POA: Diagnosis not present

## 2018-05-21 DIAGNOSIS — M9903 Segmental and somatic dysfunction of lumbar region: Secondary | ICD-10-CM | POA: Diagnosis not present

## 2018-05-21 DIAGNOSIS — M9901 Segmental and somatic dysfunction of cervical region: Secondary | ICD-10-CM | POA: Diagnosis not present

## 2018-05-21 DIAGNOSIS — M9902 Segmental and somatic dysfunction of thoracic region: Secondary | ICD-10-CM | POA: Diagnosis not present

## 2018-05-21 DIAGNOSIS — M62830 Muscle spasm of back: Secondary | ICD-10-CM | POA: Diagnosis not present

## 2018-05-24 IMAGING — MG DIGITAL SCREENING BILATERAL MAMMOGRAM WITH CAD
4 series · 4 of 4 positions shown · non-contrast
Comparison: Previous exam(s).

CLINICAL DATA: Screening.

EXAM:
DIGITAL SCREENING BILATERAL MAMMOGRAM WITH CAD

[L MLO]
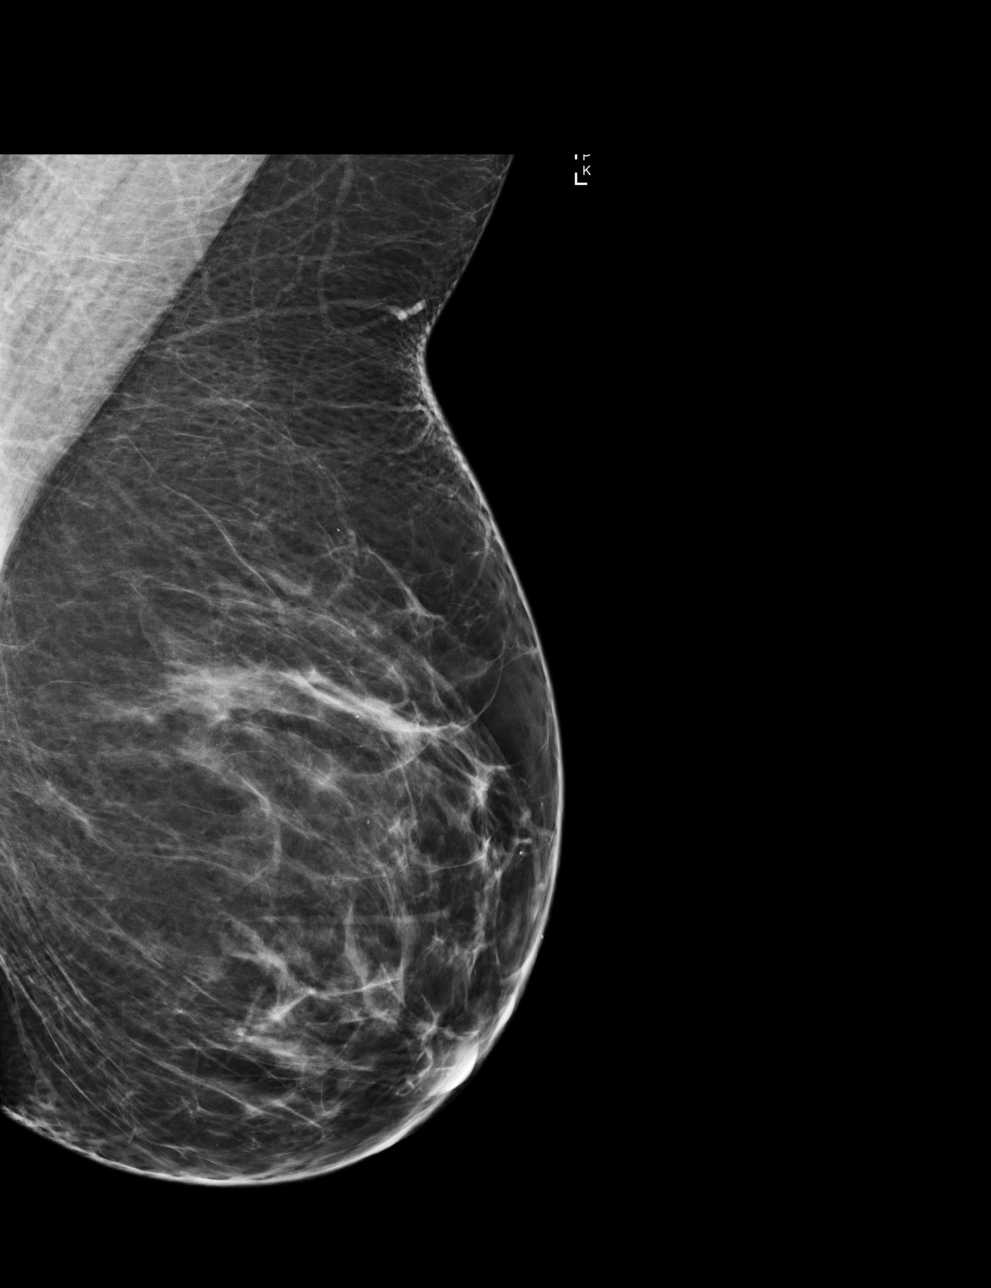

[R CC]
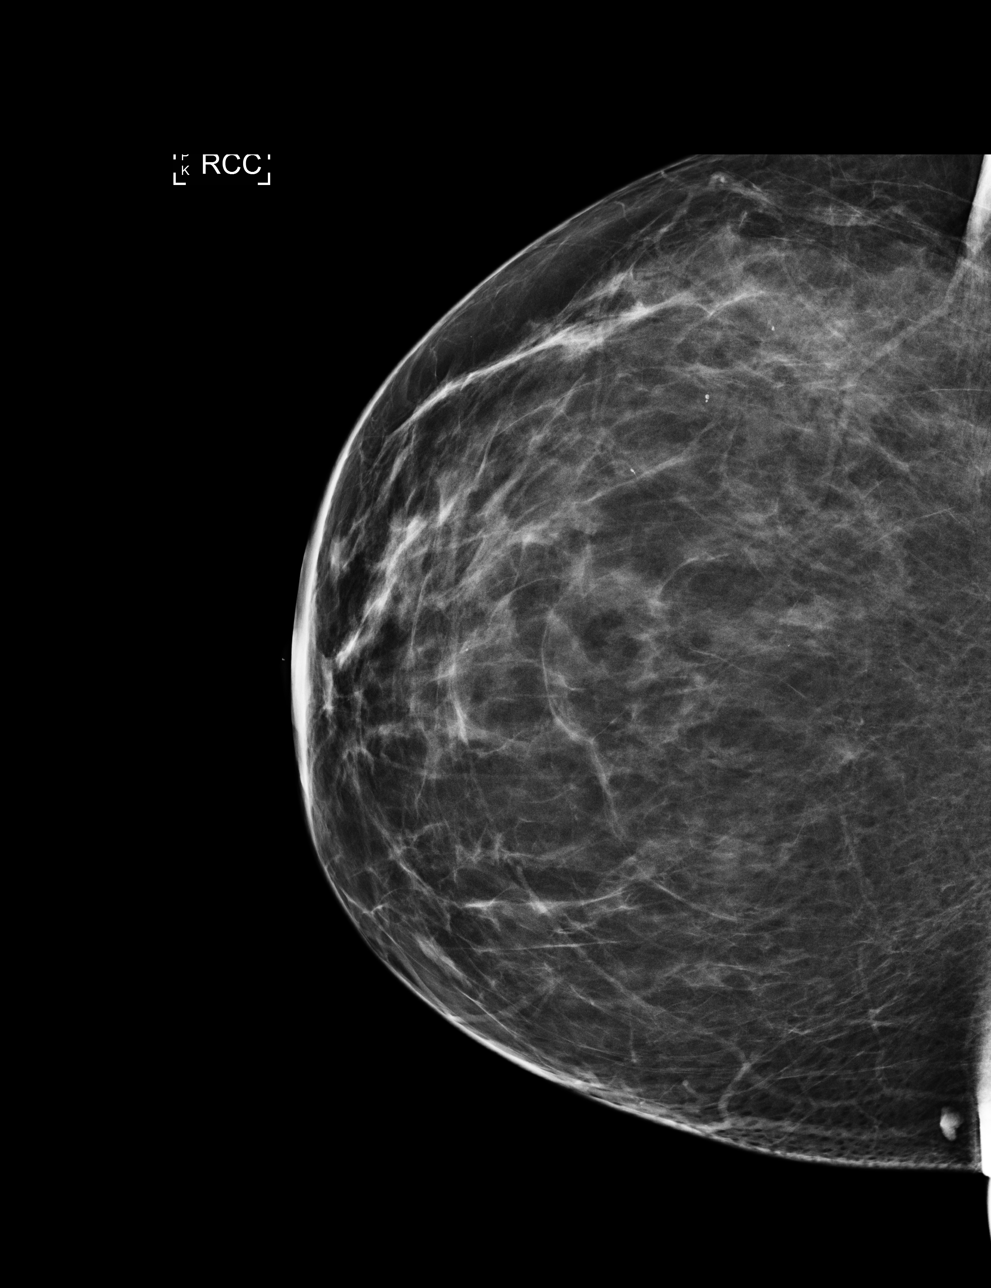

[R MLO]
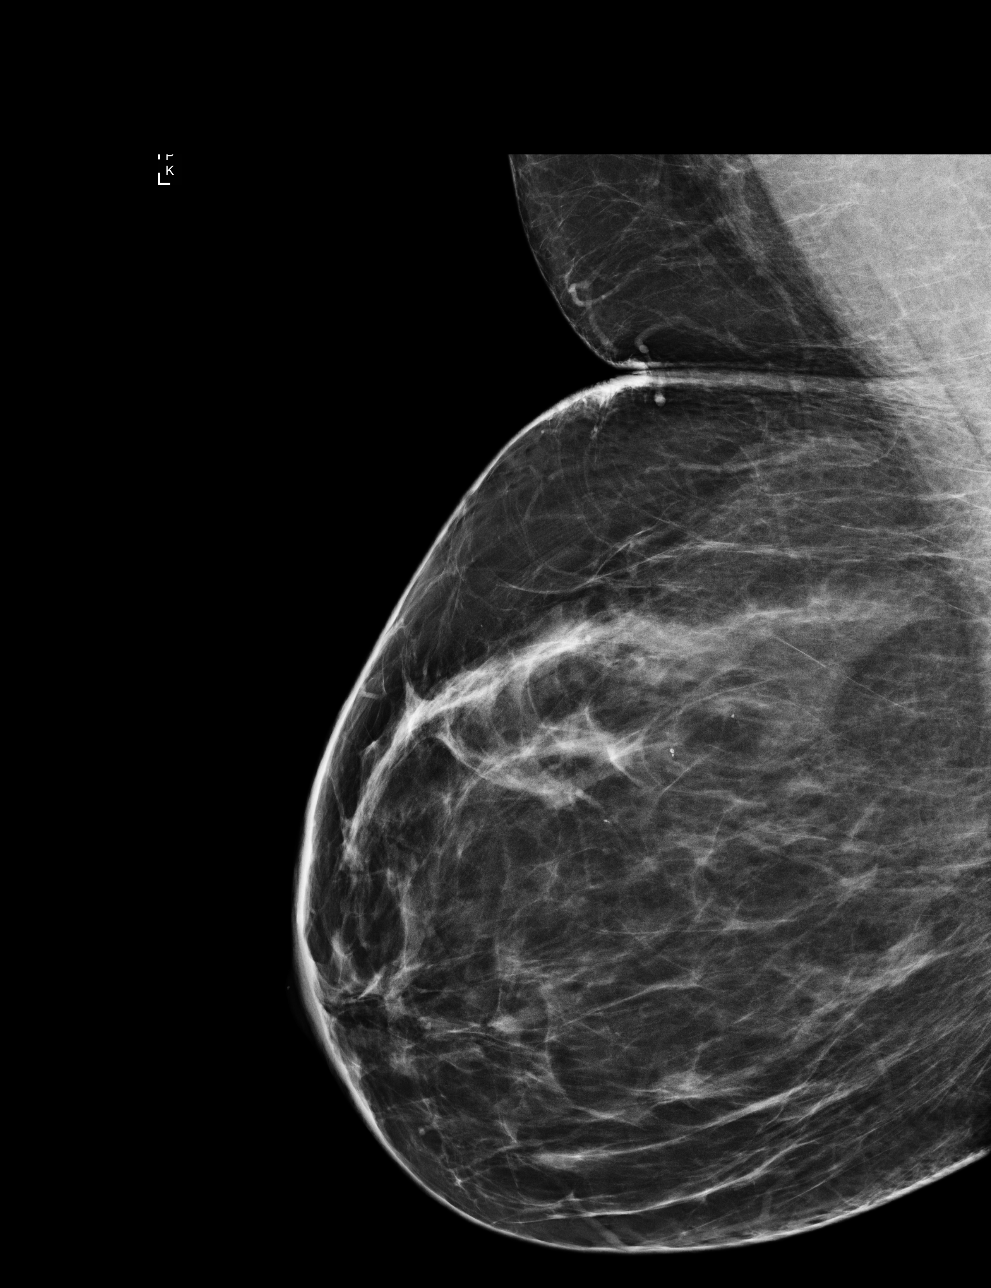

[L CC]
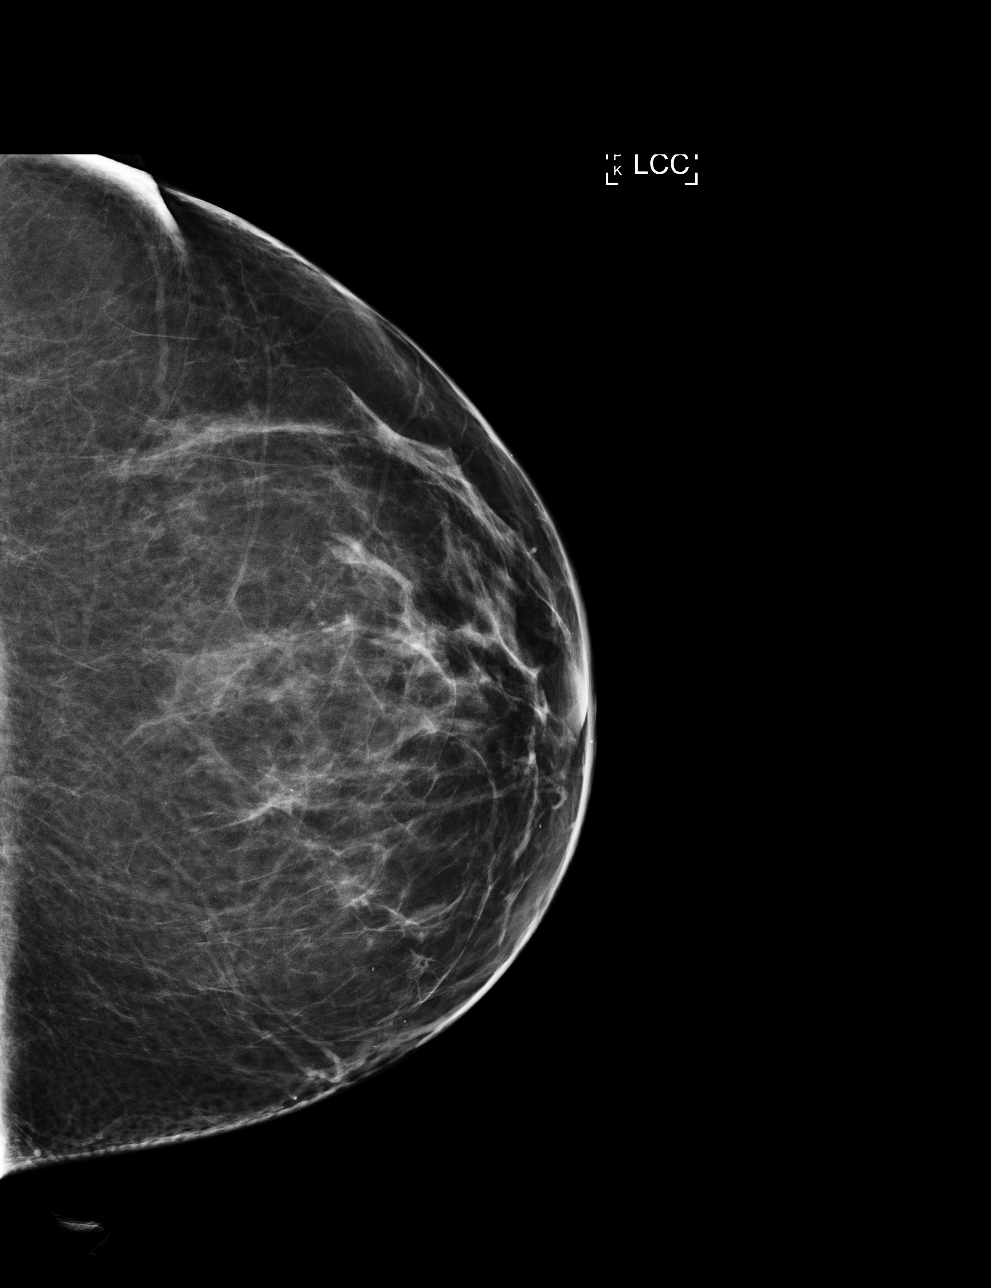

[4 of 4 positions shown; findings below may reference images not displayed]

ACR Breast Density Category b: There are scattered areas of
fibroglandular density.
FINDINGS: There are no findings suspicious for malignancy. Images were
processed with CAD.
IMPRESSION: No mammographic evidence of malignancy. A result letter of this
screening mammogram will be mailed directly to the patient.

RECOMMENDATION:
Screening mammogram in one year. (Code:AS-G-LCT)

BI-RADS CATEGORY  1: Negative.

## 2018-06-18 DIAGNOSIS — M9903 Segmental and somatic dysfunction of lumbar region: Secondary | ICD-10-CM | POA: Diagnosis not present

## 2018-06-18 DIAGNOSIS — M62830 Muscle spasm of back: Secondary | ICD-10-CM | POA: Diagnosis not present

## 2018-06-18 DIAGNOSIS — M9902 Segmental and somatic dysfunction of thoracic region: Secondary | ICD-10-CM | POA: Diagnosis not present

## 2018-06-18 DIAGNOSIS — M9901 Segmental and somatic dysfunction of cervical region: Secondary | ICD-10-CM | POA: Diagnosis not present

## 2018-11-16 ENCOUNTER — Other Ambulatory Visit: Payer: Self-pay | Admitting: Obstetrics and Gynecology

## 2018-11-16 DIAGNOSIS — Z1231 Encounter for screening mammogram for malignant neoplasm of breast: Secondary | ICD-10-CM

## 2018-12-03 DIAGNOSIS — D225 Melanocytic nevi of trunk: Secondary | ICD-10-CM | POA: Diagnosis not present

## 2018-12-03 DIAGNOSIS — L700 Acne vulgaris: Secondary | ICD-10-CM | POA: Diagnosis not present

## 2018-12-03 DIAGNOSIS — D2261 Melanocytic nevi of right upper limb, including shoulder: Secondary | ICD-10-CM | POA: Diagnosis not present

## 2018-12-03 DIAGNOSIS — D2262 Melanocytic nevi of left upper limb, including shoulder: Secondary | ICD-10-CM | POA: Diagnosis not present

## 2018-12-24 DIAGNOSIS — M9903 Segmental and somatic dysfunction of lumbar region: Secondary | ICD-10-CM | POA: Diagnosis not present

## 2018-12-24 DIAGNOSIS — M9902 Segmental and somatic dysfunction of thoracic region: Secondary | ICD-10-CM | POA: Diagnosis not present

## 2018-12-24 DIAGNOSIS — M62830 Muscle spasm of back: Secondary | ICD-10-CM | POA: Diagnosis not present

## 2018-12-24 DIAGNOSIS — M9901 Segmental and somatic dysfunction of cervical region: Secondary | ICD-10-CM | POA: Diagnosis not present

## 2018-12-28 DIAGNOSIS — M9901 Segmental and somatic dysfunction of cervical region: Secondary | ICD-10-CM | POA: Diagnosis not present

## 2018-12-28 DIAGNOSIS — M9903 Segmental and somatic dysfunction of lumbar region: Secondary | ICD-10-CM | POA: Diagnosis not present

## 2018-12-28 DIAGNOSIS — M9902 Segmental and somatic dysfunction of thoracic region: Secondary | ICD-10-CM | POA: Diagnosis not present

## 2018-12-28 DIAGNOSIS — M62830 Muscle spasm of back: Secondary | ICD-10-CM | POA: Diagnosis not present

## 2018-12-31 DIAGNOSIS — M9902 Segmental and somatic dysfunction of thoracic region: Secondary | ICD-10-CM | POA: Diagnosis not present

## 2018-12-31 DIAGNOSIS — M9901 Segmental and somatic dysfunction of cervical region: Secondary | ICD-10-CM | POA: Diagnosis not present

## 2018-12-31 DIAGNOSIS — M9903 Segmental and somatic dysfunction of lumbar region: Secondary | ICD-10-CM | POA: Diagnosis not present

## 2018-12-31 DIAGNOSIS — M62830 Muscle spasm of back: Secondary | ICD-10-CM | POA: Diagnosis not present

## 2019-01-14 DIAGNOSIS — M9902 Segmental and somatic dysfunction of thoracic region: Secondary | ICD-10-CM | POA: Diagnosis not present

## 2019-01-14 DIAGNOSIS — M9901 Segmental and somatic dysfunction of cervical region: Secondary | ICD-10-CM | POA: Diagnosis not present

## 2019-01-14 DIAGNOSIS — M62830 Muscle spasm of back: Secondary | ICD-10-CM | POA: Diagnosis not present

## 2019-01-14 DIAGNOSIS — M9903 Segmental and somatic dysfunction of lumbar region: Secondary | ICD-10-CM | POA: Diagnosis not present

## 2019-01-15 ENCOUNTER — Other Ambulatory Visit: Payer: Self-pay

## 2019-01-15 ENCOUNTER — Ambulatory Visit
Admission: RE | Admit: 2019-01-15 | Discharge: 2019-01-15 | Disposition: A | Discharge status: Home / Self Care | Payer: BLUE CROSS/BLUE SHIELD | Source: Ambulatory Visit | Attending: Obstetrics and Gynecology | Admitting: Obstetrics and Gynecology

## 2019-01-15 DIAGNOSIS — Z1231 Encounter for screening mammogram for malignant neoplasm of breast: Secondary | ICD-10-CM | POA: Diagnosis not present

## 2019-01-28 DIAGNOSIS — M62830 Muscle spasm of back: Secondary | ICD-10-CM | POA: Diagnosis not present

## 2019-01-28 DIAGNOSIS — M9903 Segmental and somatic dysfunction of lumbar region: Secondary | ICD-10-CM | POA: Diagnosis not present

## 2019-01-28 DIAGNOSIS — M9901 Segmental and somatic dysfunction of cervical region: Secondary | ICD-10-CM | POA: Diagnosis not present

## 2019-01-28 DIAGNOSIS — M9902 Segmental and somatic dysfunction of thoracic region: Secondary | ICD-10-CM | POA: Diagnosis not present

## 2019-02-25 DIAGNOSIS — M9901 Segmental and somatic dysfunction of cervical region: Secondary | ICD-10-CM | POA: Diagnosis not present

## 2019-02-25 DIAGNOSIS — M9902 Segmental and somatic dysfunction of thoracic region: Secondary | ICD-10-CM | POA: Diagnosis not present

## 2019-02-25 DIAGNOSIS — M62830 Muscle spasm of back: Secondary | ICD-10-CM | POA: Diagnosis not present

## 2019-02-25 DIAGNOSIS — M9903 Segmental and somatic dysfunction of lumbar region: Secondary | ICD-10-CM | POA: Diagnosis not present

## 2019-03-01 DIAGNOSIS — H31003 Unspecified chorioretinal scars, bilateral: Secondary | ICD-10-CM | POA: Diagnosis not present

## 2019-03-01 DIAGNOSIS — H5213 Myopia, bilateral: Secondary | ICD-10-CM | POA: Diagnosis not present

## 2019-07-02 ENCOUNTER — Other Ambulatory Visit: Payer: Self-pay

## 2019-07-02 DIAGNOSIS — Z20822 Contact with and (suspected) exposure to covid-19: Secondary | ICD-10-CM

## 2019-07-04 ENCOUNTER — Telehealth: Payer: Self-pay | Admitting: *Deleted

## 2019-07-04 NOTE — Telephone Encounter (Signed)
Pt calling for covid results; active, Pt aware not resulted yet.

## 2019-07-05 LAB — NOVEL CORONAVIRUS, NAA: SARS-CoV-2, NAA: NOT DETECTED

## 2019-07-29 DIAGNOSIS — M9901 Segmental and somatic dysfunction of cervical region: Secondary | ICD-10-CM | POA: Diagnosis not present

## 2019-07-29 DIAGNOSIS — M9903 Segmental and somatic dysfunction of lumbar region: Secondary | ICD-10-CM | POA: Diagnosis not present

## 2019-07-29 DIAGNOSIS — M9902 Segmental and somatic dysfunction of thoracic region: Secondary | ICD-10-CM | POA: Diagnosis not present

## 2019-07-29 DIAGNOSIS — M62830 Muscle spasm of back: Secondary | ICD-10-CM | POA: Diagnosis not present

## 2019-07-30 ENCOUNTER — Other Ambulatory Visit: Payer: Self-pay

## 2019-07-30 ENCOUNTER — Ambulatory Visit: Payer: BC Managed Care – PPO | Attending: Internal Medicine

## 2019-07-30 DIAGNOSIS — Z20822 Contact with and (suspected) exposure to covid-19: Secondary | ICD-10-CM

## 2019-07-31 LAB — NOVEL CORONAVIRUS, NAA: SARS-CoV-2, NAA: NOT DETECTED

## 2019-08-02 DIAGNOSIS — M9901 Segmental and somatic dysfunction of cervical region: Secondary | ICD-10-CM | POA: Diagnosis not present

## 2019-08-02 DIAGNOSIS — M9902 Segmental and somatic dysfunction of thoracic region: Secondary | ICD-10-CM | POA: Diagnosis not present

## 2019-08-02 DIAGNOSIS — M9903 Segmental and somatic dysfunction of lumbar region: Secondary | ICD-10-CM | POA: Diagnosis not present

## 2019-08-02 DIAGNOSIS — M62830 Muscle spasm of back: Secondary | ICD-10-CM | POA: Diagnosis not present

## 2020-01-05 ENCOUNTER — Other Ambulatory Visit: Payer: Self-pay | Admitting: Obstetrics and Gynecology

## 2020-01-05 DIAGNOSIS — Z1231 Encounter for screening mammogram for malignant neoplasm of breast: Secondary | ICD-10-CM

## 2020-01-21 ENCOUNTER — Ambulatory Visit
Admission: RE | Admit: 2020-01-21 | Discharge: 2020-01-21 | Disposition: A | Discharge status: Home / Self Care | Payer: BC Managed Care – PPO | Source: Ambulatory Visit | Attending: Obstetrics and Gynecology | Admitting: Obstetrics and Gynecology

## 2020-01-21 ENCOUNTER — Other Ambulatory Visit: Payer: Self-pay

## 2020-01-21 DIAGNOSIS — Z1231 Encounter for screening mammogram for malignant neoplasm of breast: Secondary | ICD-10-CM

## 2020-07-23 IMAGING — MG DIGITAL SCREENING BILAT W/ TOMO W/ CAD
8 series · 8 of 24 positions shown · non-contrast
Comparison: Previous exam(s).

CLINICAL DATA: Screening.

EXAM:
DIGITAL SCREENING BILATERAL MAMMOGRAM WITH TOMO AND CAD

[L CC synth-2D]
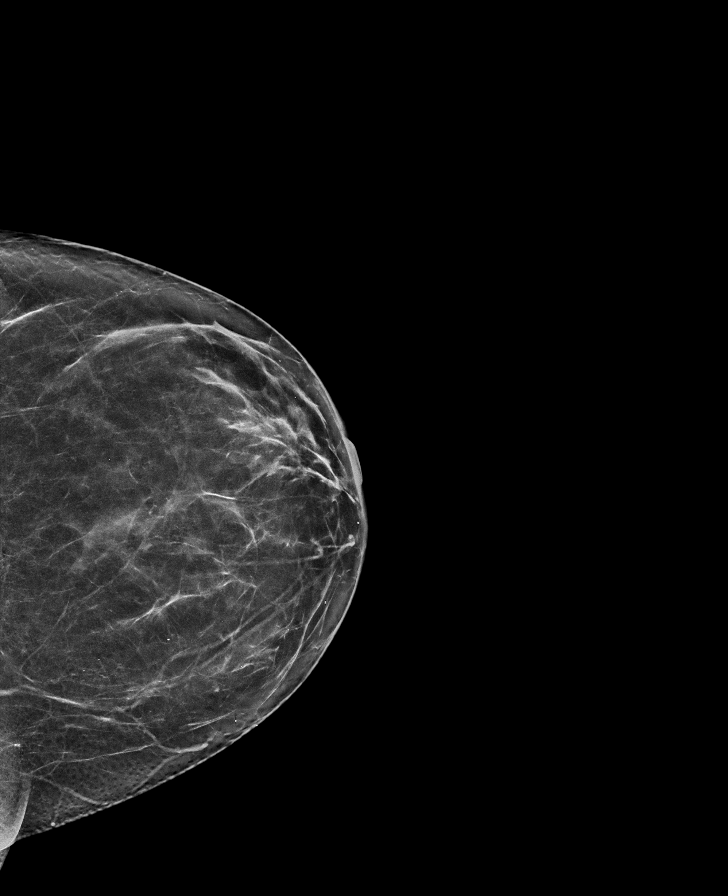

[L MLO synth-2D]
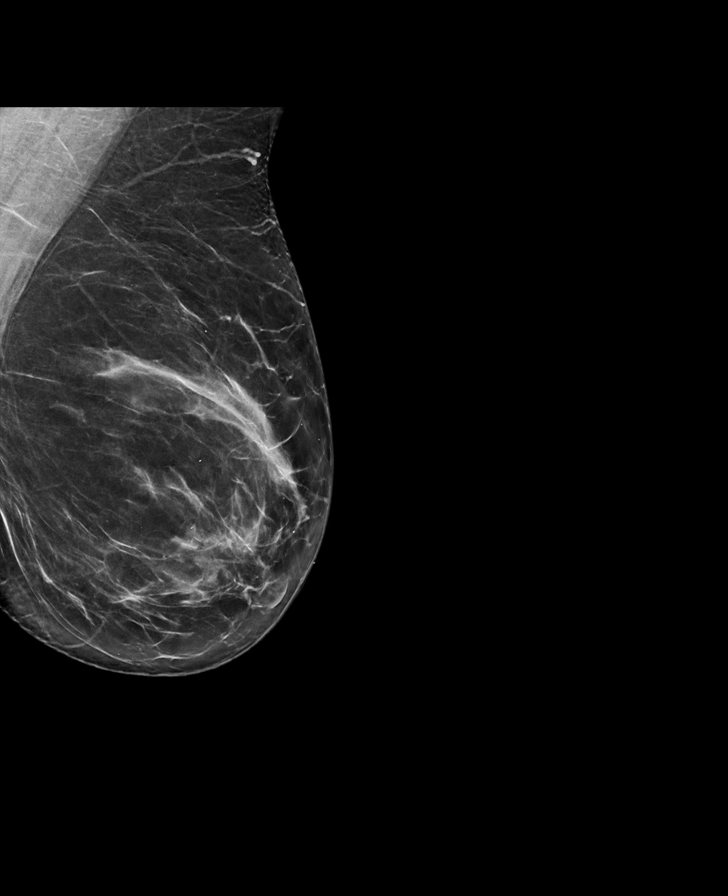

[R MLO synth-2D]
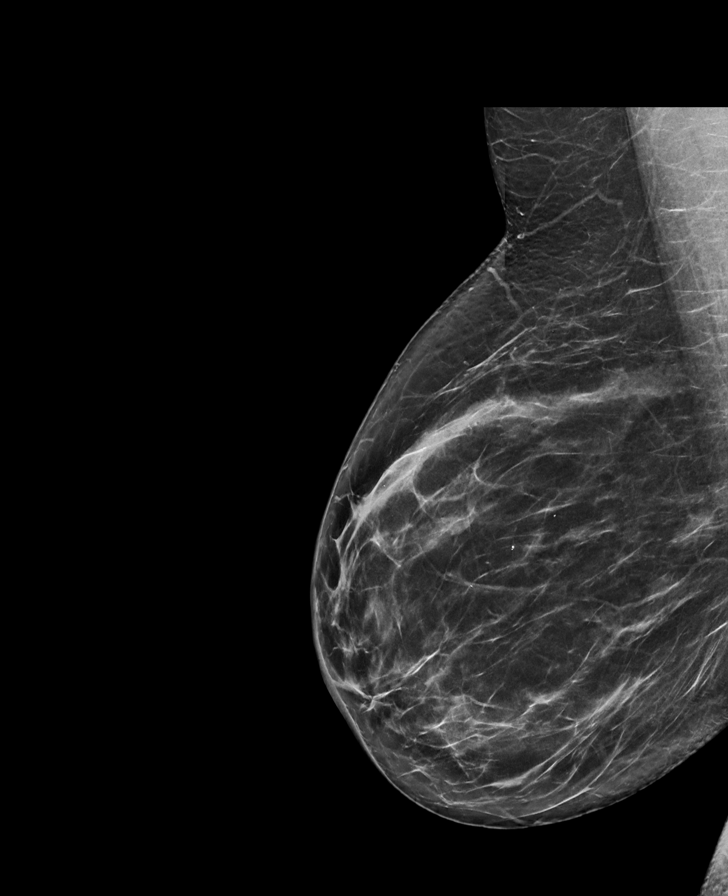

[R CC synth-2D]
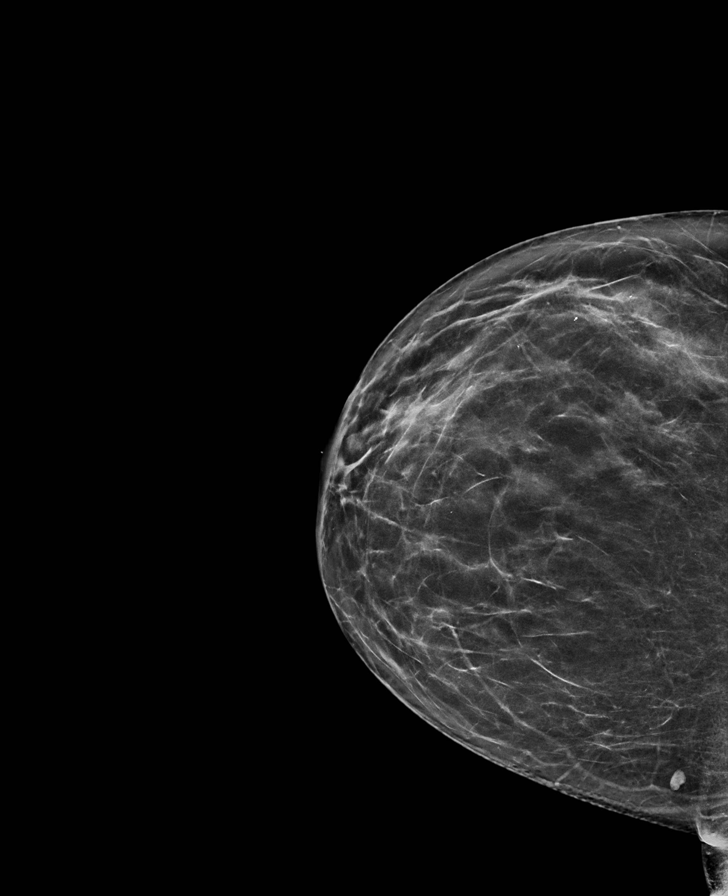

[R MLO tomo · tomo slice 39/77.0]
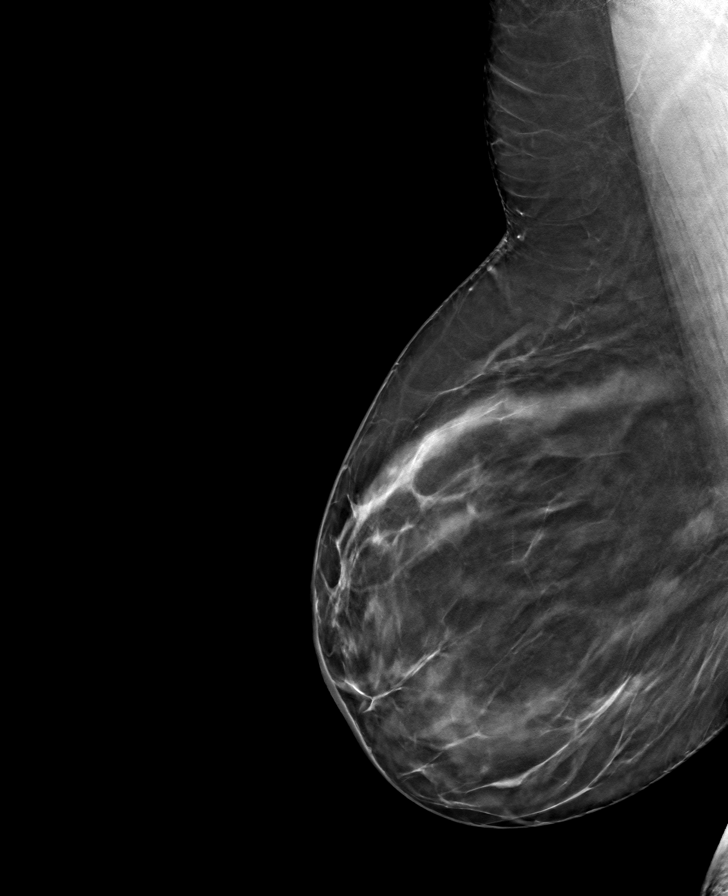

[L MLO tomo · tomo slice 40/79.0]
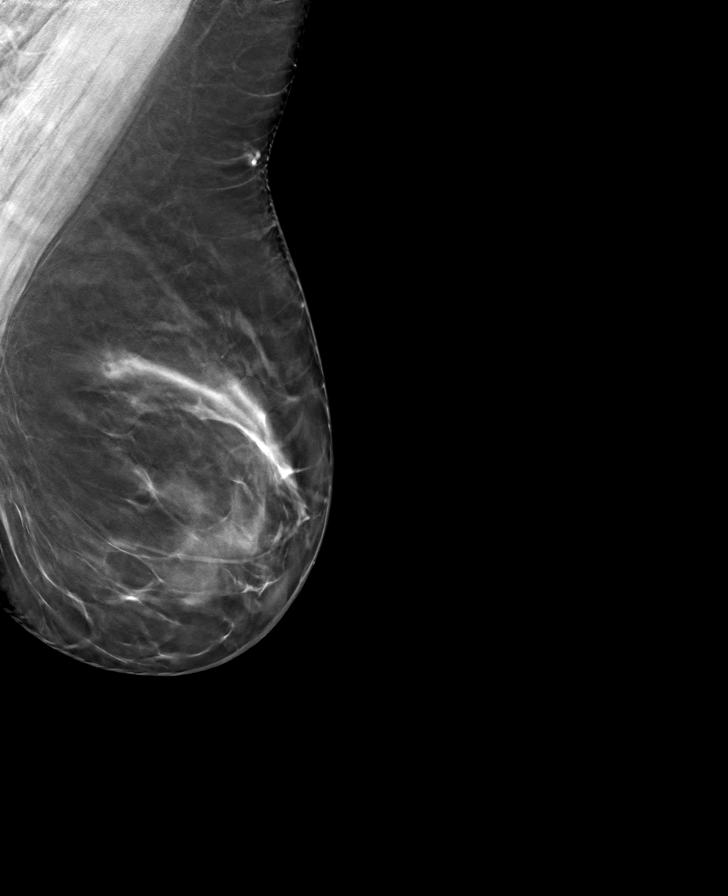

[L CC tomo · tomo slice 35/69.0]
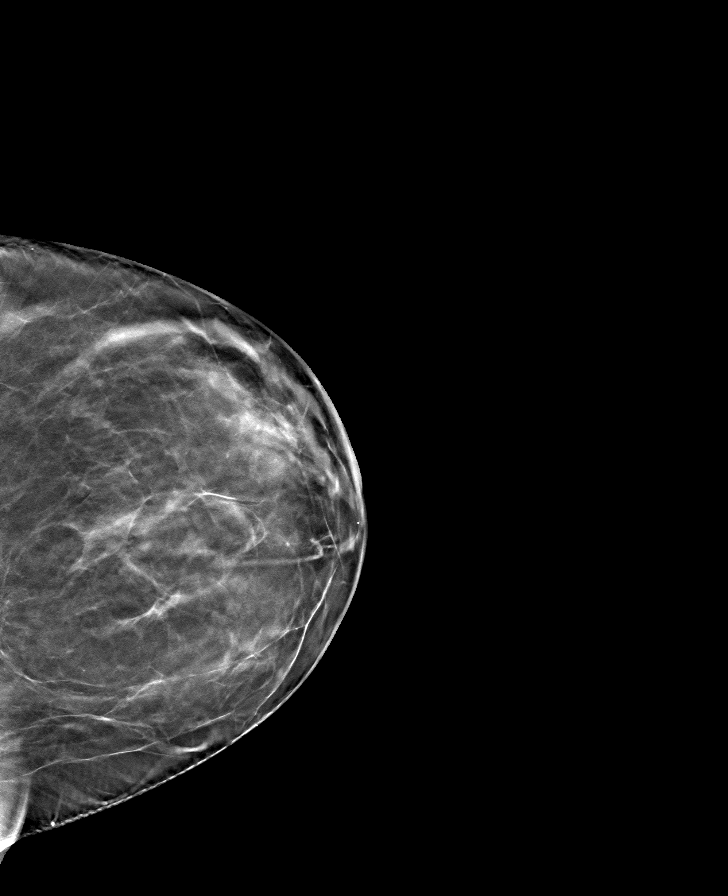

[R CC tomo · tomo slice 34/67.0]
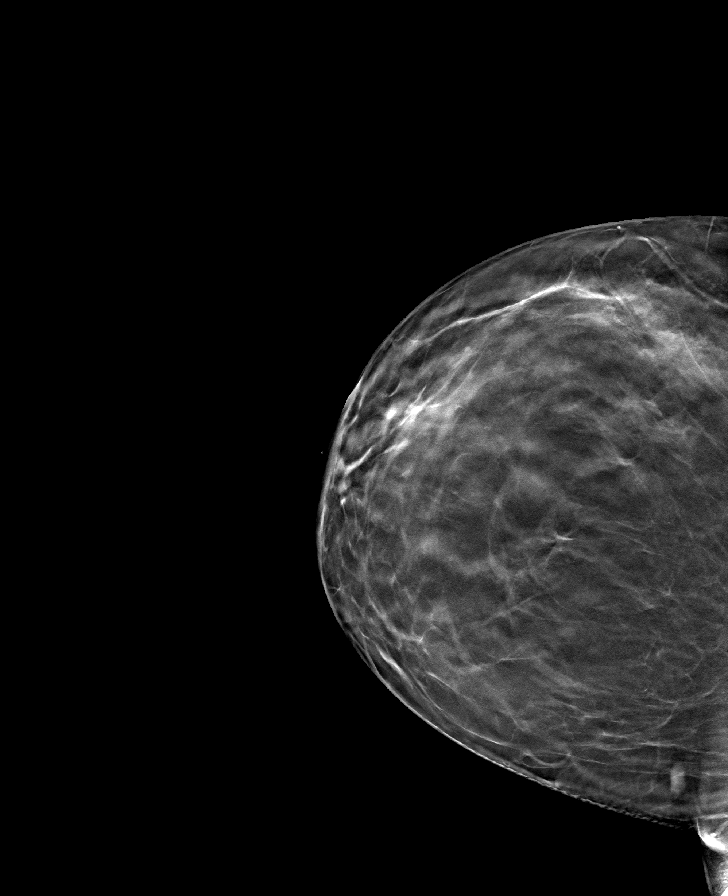

[8 of 24 positions shown; findings below may reference images not displayed]

ACR Breast Density Category b: There are scattered areas of
fibroglandular density.
FINDINGS: There are no findings suspicious for malignancy. Images were
processed with CAD.
IMPRESSION: No mammographic evidence of malignancy. A result letter of this
screening mammogram will be mailed directly to the patient.

RECOMMENDATION:
Screening mammogram in one year. (Code:CN-U-775)

BI-RADS CATEGORY  1: Negative.

## 2020-12-12 ENCOUNTER — Other Ambulatory Visit: Payer: Self-pay | Admitting: Obstetrics and Gynecology

## 2020-12-12 DIAGNOSIS — Z1231 Encounter for screening mammogram for malignant neoplasm of breast: Secondary | ICD-10-CM

## 2021-02-06 ENCOUNTER — Other Ambulatory Visit: Payer: Self-pay

## 2021-02-06 ENCOUNTER — Ambulatory Visit
Admission: RE | Admit: 2021-02-06 | Discharge: 2021-02-06 | Disposition: A | Discharge status: Home / Self Care | Payer: BC Managed Care – PPO | Source: Ambulatory Visit | Attending: Obstetrics and Gynecology | Admitting: Obstetrics and Gynecology

## 2021-02-06 DIAGNOSIS — Z1231 Encounter for screening mammogram for malignant neoplasm of breast: Secondary | ICD-10-CM

## 2021-03-27 NOTE — Progress Notes (Signed)
50 y.o. G0P0 Single Caucasian female here for annual exam.    Some increased heat every now and then for the last 6 months.   Dealing with some weight gain.  Declines STD screening.   Wants routine labs including vit D.  Having right knee issues.   PCP:   Cathren Laine, MD  Patient's last menstrual period was 08/18/2014.           Sexually active: No.  The current method of family planning is status post hysterectomy.    Exercising: Yes.     Walking, PT for knee Smoker:  no  Health Maintenance: Pap: 09-18-11 Neg:Neg HR HPV History of abnormal Pap:  no MMG: 02-06-21 3D/Neg/Birads1 Colonoscopy:  NEVER BMD:   n/a  Result  n/a TDaP:  up to date with HD Gardasil:   no HIV:Neg in the past Hep C: no Screening Labs:  today.   reports that she has never smoked. She has never used smokeless tobacco. She reports that she does not drink alcohol and does not use drugs.  Past Medical History:  Diagnosis Date   Hypertension    Lower back pain    L4-L5    Past Surgical History:  Procedure Laterality Date   ABDOMINAL HYSTERECTOMY N/A 12/13/2014   Procedure: HYSTERECTOMY ABDOMINAL;  Surgeon: Nunzio Cobbs, MD;  Location: [facility];  Service: Gynecology;  Laterality: N/A;  3 hours   SALPINGOOPHORECTOMY Left 12/13/2014   Procedure: SALPINGO OOPHORECTOMY;  Surgeon: Nunzio Cobbs, MD;  Location: [facility];  Service: Gynecology;  Laterality: Left;   UNILATERAL SALPINGECTOMY Right 12/13/2014   Procedure: RIGHT SALPINGECTOMY;  Surgeon: Nunzio Cobbs, MD;  Location: [facility];  Service: Gynecology;  Laterality: Right;   WISDOM TOOTH EXTRACTION      Current Outpatient Medications  Medication Sig Dispense Refill   calcium carbonate (TUMS - DOSED IN MG ELEMENTAL CALCIUM) 500 MG chewable tablet Chew 1 tablet by mouth daily.     hydrochlorothiazide (MICROZIDE) 12.5 MG capsule Take 12.5 mg by mouth every morning.     Methylsulfonylmethane (MSM PO) Take 1 tablet by mouth daily.      Multiple Vitamin (MULTIVITAMIN) capsule Take 1 capsule by mouth daily.     Omega-3 Fatty Acids (FISH OIL PO) Take by mouth.     VITAMIN E PO Take by mouth.     No current facility-administered medications for this visit.    Family History  Problem Relation Age of Onset   Hypertension Mother    Thyroid disease Mother    Other Father 40       Dec--rare form of TB   Breast cancer Neg Hx     Review of Systems  All other systems reviewed and are negative.  Exam:   BP 140/88   Pulse 70   Resp 16   Ht '5\' 6"'$  (1.676 m)   Wt 248 lb (112.5 kg)   LMP 08/18/2014   BMI 40.03 kg/m     General appearance: alert, cooperative and appears stated age Head: normocephalic, without obvious abnormality, atraumatic Neck: no adenopathy, supple, symmetrical, trachea midline and thyroid normal to inspection and palpation Lungs: clear to auscultation bilaterally Breasts: normal appearance, no masses or tenderness, No nipple retraction or dimpling, No nipple discharge or bleeding, No axillary adenopathy Heart: regular rate and rhythm Abdomen: soft, non-tender; no masses, no organomegaly Extremities: extremities normal, atraumatic, no cyanosis or edema Skin: skin color, texture, turgor normal. No rashes or lesions Lymph  nodes: cervical, supraclavicular, and axillary nodes normal. Neurologic: grossly normal  Pelvic: External genitalia:  no lesions              No abnormal inguinal nodes palpated.              Urethra:  normal appearing urethra with no masses, tenderness or lesions              Bartholins and Skenes: normal                 Vagina: normal appearing vagina with normal color and discharge, no lesions              Cervix: absent              Pap taken: no Bimanual Exam:  Uterus:  absent              Adnexa: no mass, fullness, tenderness              Rectal exam: Yes.  .  Confirms.              Anus:  normal sphincter tone, no lesions  Chaperone was present for exam:  Estill Bamberg,  CMA  Assessment:   Well woman visit with gynecologic exam. 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  Plan: Mammogram screening discussed. Self breast awareness reviewed. Pap and HR HPV as above. Guidelines for Calcium, Vitamin D, regular exercise program including cardiovascular and weight bearing exercise. Referral for colonoscopy.  Routine labs and vit D. Follow up annually and prn.    After visit summary provided.

## 2021-03-29 ENCOUNTER — Encounter: Payer: Self-pay | Admitting: Obstetrics and Gynecology

## 2021-03-29 ENCOUNTER — Other Ambulatory Visit: Payer: Self-pay

## 2021-03-29 ENCOUNTER — Ambulatory Visit (INDEPENDENT_AMBULATORY_CARE_PROVIDER_SITE_OTHER): Payer: BC Managed Care – PPO | Admitting: Obstetrics and Gynecology

## 2021-03-29 VITALS — BP 140/88 | HR 70 | Resp 16 | Ht 66.0 in | Wt 248.0 lb

## 2021-03-29 DIAGNOSIS — Z1211 Encounter for screening for malignant neoplasm of colon: Secondary | ICD-10-CM | POA: Diagnosis not present

## 2021-03-29 DIAGNOSIS — Z01419 Encounter for gynecological examination (general) (routine) without abnormal findings: Secondary | ICD-10-CM

## 2021-03-29 NOTE — Patient Instructions (Signed)

## 2021-03-30 LAB — COMPREHENSIVE METABOLIC PANEL
AG Ratio: 1.8 (calc) (ref 1.0–2.5)
ALT: 17 U/L (ref 6–29)
AST: 19 U/L (ref 10–35)
Albumin: 4.9 g/dL (ref 3.6–5.1)
Alkaline phosphatase (APISO): 47 U/L (ref 37–153)
BUN: 22 mg/dL (ref 7–25)
CO2: 26 mmol/L (ref 20–32)
Calcium: 10.4 mg/dL (ref 8.6–10.4)
Chloride: 101 mmol/L (ref 98–110)
Creat: 0.83 mg/dL (ref 0.50–1.03)
Globulin: 2.7 g/dL (calc) (ref 1.9–3.7)
Glucose, Bld: 98 mg/dL (ref 65–99)
Potassium: 4.4 mmol/L (ref 3.5–5.3)
Sodium: 139 mmol/L (ref 135–146)
Total Bilirubin: 0.6 mg/dL (ref 0.2–1.2)
Total Protein: 7.6 g/dL (ref 6.1–8.1)

## 2021-03-30 LAB — CBC
HCT: 44.6 % (ref 35.0–45.0)
Hemoglobin: 15.2 g/dL (ref 11.7–15.5)
MCH: 29.3 pg (ref 27.0–33.0)
MCHC: 34.1 g/dL (ref 32.0–36.0)
MCV: 86.1 fL (ref 80.0–100.0)
MPV: 9.3 fL (ref 7.5–12.5)
Platelets: 300 10*3/uL (ref 140–400)
RBC: 5.18 10*6/uL — ABNORMAL HIGH (ref 3.80–5.10)
RDW: 12.8 % (ref 11.0–15.0)
WBC: 5.1 10*3/uL (ref 3.8–10.8)

## 2021-03-30 LAB — LIPID PANEL
Cholesterol: 182 mg/dL (ref <200)
HDL: 55 mg/dL (ref >=50)
LDL Cholesterol (Calc): 103 mg/dL (calc) — ABNORMAL HIGH
Non-HDL Cholesterol (Calc): 127 mg/dL (calc) (ref <130)
Total CHOL/HDL Ratio: 3.3 (calc) (ref <5.0)
Triglycerides: 143 mg/dL (ref <150)

## 2021-03-30 LAB — VITAMIN D 25 HYDROXY (VIT D DEFICIENCY, FRACTURES): Vit D, 25-Hydroxy: 30 ng/mL (ref 30–100)

## 2022-01-09 ENCOUNTER — Other Ambulatory Visit: Payer: Self-pay | Admitting: Obstetrics and Gynecology

## 2022-01-09 DIAGNOSIS — Z1231 Encounter for screening mammogram for malignant neoplasm of breast: Secondary | ICD-10-CM

## 2022-02-08 ENCOUNTER — Ambulatory Visit: Payer: BC Managed Care – PPO

## 2022-02-25 ENCOUNTER — Ambulatory Visit
Admission: RE | Admit: 2022-02-25 | Discharge: 2022-02-25 | Disposition: A | Discharge status: Home / Self Care | Payer: BC Managed Care – PPO | Source: Ambulatory Visit | Attending: Obstetrics and Gynecology | Admitting: Obstetrics and Gynecology

## 2022-02-25 DIAGNOSIS — Z1231 Encounter for screening mammogram for malignant neoplasm of breast: Secondary | ICD-10-CM

## 2022-03-28 NOTE — Progress Notes (Signed)
51 y.o. G0P0 Single Caucasian female here for annual exam.  Buttock cyst.   PCP: Cathren Laine, MD   Patient's last menstrual period was 08/18/2014.           Sexually active: No.  The current method of family planning is status post hysterectomy.    Exercising: No.   Some walking, stretches, DVD work out Smoker:  no  Health Maintenance: Pap:  09-18-11 Neg:Neg HR HPV History of abnormal Pap:  no MMG: 02-25-22 Neg/BiRads1 Colonoscopy:  NEVER BMD:   n/a  Result  n/a TDaP: 2014 Gardasil:   no HIV:Neg in past Hep C:no Screening Labs:  PCP  Received her Shingrix vaccine x 2.    reports that she has never smoked. She has never used smokeless tobacco. She reports that she does not drink alcohol and does not use drugs.  Past Medical History:  Diagnosis Date   Hypertension    Lower back pain    L4-L5    Past Surgical History:  Procedure Laterality Date   ABDOMINAL HYSTERECTOMY N/A 12/13/2014   Procedure: HYSTERECTOMY ABDOMINAL;  Surgeon: Nunzio Cobbs, MD;  Location: [facility];  Service: Gynecology;  Laterality: N/A;  3 hours   SALPINGOOPHORECTOMY Left 12/13/2014   Procedure: SALPINGO OOPHORECTOMY;  Surgeon: Nunzio Cobbs, MD;  Location: [facility];  Service: Gynecology;  Laterality: Left;   UNILATERAL SALPINGECTOMY Right 12/13/2014   Procedure: RIGHT SALPINGECTOMY;  Surgeon: Nunzio Cobbs, MD;  Location: [facility];  Service: Gynecology;  Laterality: Right;   WISDOM TOOTH EXTRACTION      Current Outpatient Medications  Medication Sig Dispense Refill   cholecalciferol (VITAMIN D3) 25 MCG (1000 UNIT) tablet 1 tablet Orally Once a day for 30 day(s)     hydrochlorothiazide (MICROZIDE) 12.5 MG capsule Take 12.5 mg by mouth every morning.     Methylsulfonylmethane (MSM PO) Take 1 tablet by mouth daily.     Multiple Vitamin (MULTIVITAMIN) capsule Take 1 capsule by mouth daily.     Omega-3 Fatty Acids (FISH OIL PO) Take by mouth.     VITAMIN E PO Take by mouth.      No current facility-administered medications for this visit.    Family History  Problem Relation Age of Onset   Hypertension Mother    Thyroid disease Mother    Other Father 54       Dec--rare form of TB   Breast cancer Neg Hx     Review of Systems  Skin:        Cyst right perineal/buttocks area  All other systems reviewed and are negative.   Exam:   BP 138/82   Pulse 81   Ht '5\' 6"'$  (1.676 m)   Wt 252 lb (114.3 kg)   LMP 08/18/2014   SpO2 98%   BMI 40.67 kg/m     General appearance: alert, cooperative and appears stated age Head: normocephalic, without obvious abnormality, atraumatic Neck: no adenopathy, supple, symmetrical, trachea midline and thyroid normal to inspection and palpation Lungs: clear to auscultation bilaterally Breasts: normal appearance, no masses or tenderness, No nipple retraction or dimpling, No nipple discharge or bleeding, No axillary adenopathy Heart: regular rate and rhythm Abdomen: soft, non-tender; no masses, no organomegaly Extremities: extremities normal, atraumatic, no cyanosis or edema Skin: skin color, texture, turgor normal. No rashes or lesions.  Right medial buttock with 3 - 4 mm follicle with mild erythema.  Lymph nodes: cervical, supraclavicular, and axillary nodes normal. Neurologic: grossly  normal  Pelvic: External genitalia:  no lesions              No abnormal inguinal nodes palpated.              Urethra:  normal appearing urethra with no masses, tenderness or lesions              Bartholins and Skenes: normal                 Vagina: normal appearing vagina with normal color and discharge, no lesions              Cervix:  absent              Pap taken: no Bimanual Exam:  Uterus:  absent              Adnexa: no mass, fullness, tenderness              Rectal exam: yes.  Confirms.              Anus:  normal sphincter tone, split in her skin to right of anal opening.  (She endorses constipation.)  Chaperone was present for  exam:  Estill Bamberg, CMA  Assessment:   Well woman visit with gynecologic exam. 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 Right ovary remains.  Folliculitis.  Colon cancer screening.   Plan: Mammogram screening discussed. Self breast awareness reviewed. Pap and HR HPV as above. Guidelines for Calcium, Vitamin D, regular exercise program including cardiovascular and weight bearing exercise. Warm compress and Bacitracin or Neosporin to area of follicle irritation.  Increase hydration and consider stool softener or Miralax for constipation.  Currently declines colonoscopy.  Cologuard ordered.  Labs with PCP. Follow up annually and prn.   After visit summary provided.

## 2022-04-03 ENCOUNTER — Ambulatory Visit (INDEPENDENT_AMBULATORY_CARE_PROVIDER_SITE_OTHER): Payer: BC Managed Care – PPO | Admitting: Obstetrics and Gynecology

## 2022-04-03 ENCOUNTER — Encounter: Payer: Self-pay | Admitting: Obstetrics and Gynecology

## 2022-04-03 VITALS — BP 138/82 | HR 81 | Ht 66.0 in | Wt 252.0 lb

## 2022-04-03 DIAGNOSIS — Z01419 Encounter for gynecological examination (general) (routine) without abnormal findings: Secondary | ICD-10-CM | POA: Diagnosis not present

## 2022-04-03 DIAGNOSIS — Z1211 Encounter for screening for malignant neoplasm of colon: Secondary | ICD-10-CM

## 2022-04-03 NOTE — Patient Instructions (Signed)
EXERCISE AND DIET:  We recommended that you start or continue a regular exercise program for good health. Regular exercise means any activity that makes your heart beat faster and makes you sweat.  We recommend exercising at least 30 minutes per day at least 3 days a week, preferably 4 or 5.  We also recommend a diet low in fat and sugar.  Inactivity, poor dietary choices and obesity can cause diabetes, heart attack, stroke, and kidney damage, among others.    ALCOHOL AND SMOKING:  Women should limit their alcohol intake to no more than 7 drinks/beers/glasses of wine (combined, not each!) per week. Moderation of alcohol intake to this level decreases your risk of breast cancer and liver damage. And of course, no recreational drugs are part of a healthy lifestyle.  And absolutely no smoking or even second hand smoke. Most people know smoking can cause heart and lung diseases, but did you know it also contributes to weakening of your bones? Aging of your skin?  Yellowing of your teeth and nails?  CALCIUM AND VITAMIN D:  Adequate intake of calcium and Vitamin D are recommended.  The recommendations for exact amounts of these supplements seem to change often, but generally speaking 600 mg of calcium (either carbonate or citrate) and 800 units of Vitamin D per day seems prudent. Certain women may benefit from higher intake of Vitamin D.  If you are among these women, your doctor will have told you during your visit.    PAP SMEARS:  Pap smears, to check for cervical cancer or precancers,  have traditionally been done yearly, although recent scientific advances have shown that most women can have pap smears less often.  However, every woman still should have a physical exam from her gynecologist every year. It will include a breast check, inspection of the vulva and vagina to check for abnormal growths or skin changes, a visual exam of the cervix, and then an exam to evaluate the size and shape of the uterus and  ovaries.  And after 51 years of age, a rectal exam is indicated to check for rectal cancers. We will also provide age appropriate advice regarding health maintenance, like when you should have certain vaccines, screening for sexually transmitted diseases, bone density testing, colonoscopy, mammograms, etc.   MAMMOGRAMS:  All women over 40 years old should have a yearly mammogram. Many facilities now offer a "3D" mammogram, which may cost around $50 extra out of pocket. If possible,  we recommend you accept the option to have the 3D mammogram performed.  It both reduces the number of women who will be called back for extra views which then turn out to be normal, and it is better than the routine mammogram at detecting truly abnormal areas.    COLONOSCOPY:  Colonoscopy to screen for colon cancer is recommended for all women at age 50.  We know, you hate the idea of the prep.  We agree, BUT, having colon cancer and not knowing it is worse!!  Colon cancer so often starts as a polyp that can be seen and removed at colonscopy, which can quite literally save your life!  And if your first colonoscopy is normal and you have no family history of colon cancer, most women don't have to have it again for 10 years.  Once every ten years, you can do something that may end up saving your life, right?  We will be happy to help you get it scheduled when you are ready.    Be sure to check your insurance coverage so you understand how much it will cost.  It may be covered as a preventative service at no cost, but you should check your particular policy.    Calcium Content in Foods Calcium is the most abundant mineral in the body. Most of the body's calcium supply is stored in bones and teeth. Calcium helps many parts of the body function normally, including: Blood and blood vessels. Nerves. Hormones. Muscles. Bones and teeth. When your calcium stores are low, you may be at risk for low bone mass, bone loss, and broken bones  (fractures). When you get enough calcium, it helps to support strong bones and teeth throughout your life. Calcium is especially important for: Children during growth spurts. Girls during adolescence. Women who are pregnant or breastfeeding. Women after their menstrual cycle stops (postmenopause). Women whose menstrual cycle has stopped due to anorexia nervosa or regular intense exercise. People who cannot eat or digest dairy products. Vegans. Recommended daily amounts of calcium: Women (ages 19 to 50): 1,000 mg per day. Women (ages 51 and older): 1,200 mg per day. Men (ages 19 to 70): 1,000 mg per day. Men (ages 71 and older): 1,200 mg per day. Women (ages 9 to 18): 1,300 mg per day. Men (ages 9 to 18): 1,300 mg per day. General information Eat foods that are high in calcium. Try to get most of your calcium from food. Some people may benefit from taking calcium supplements. Check with your health care provider or diet and nutrition specialist (dietitian) before starting any calcium supplements. Calcium supplements may interact with certain medicines. Too much calcium may cause other health problems, such as constipation and kidney stones. For the body to absorb calcium, it needs vitamin D. Sources of vitamin D include: Skin exposure to direct sunlight. Foods, such as egg yolks, liver, mushrooms, saltwater fish, and fortified milk. Vitamin D supplements. Check with your health care provider or dietitian before starting any vitamin D supplements. What foods are high in calcium?  Foods that are high in calcium contain more than 100 milligrams per serving. Fruits Fortified orange juice or other fruit juice, 300 mg per 8 oz serving. Vegetables Collard greens, 360 mg per 8 oz serving. Kale, 100 mg per 8 oz serving. Bok choy, 160 mg per 8 oz serving. Grains Fortified ready-to-eat cereals, 100 to 1,000 mg per 8 oz serving. Fortified frozen waffles, 200 mg in 2 waffles. Oatmeal, 140 mg in  1 cup. Meats and other proteins Sardines, canned with bones, 325 mg per 3 oz serving. Salmon, canned with bones, 180 mg per 3 oz serving. Canned shrimp, 125 mg per 3 oz serving. Baked beans, 160 mg per 4 oz serving. Tofu, firm, made with calcium sulfate, 253 mg per 4 oz serving. Dairy Yogurt, plain, low-fat, 310 mg per 6 oz serving. Nonfat milk, 300 mg per 8 oz serving. American cheese, 195 mg per 1 oz serving. Cheddar cheese, 205 mg per 1 oz serving. Cottage cheese 2%, 105 mg per 4 oz serving. Fortified soy, rice, or almond milk, 300 mg per 8 oz serving. Mozzarella, part skim, 210 mg per 1 oz serving. The items listed above may not be a complete list of foods high in calcium. Actual amounts of calcium may be different depending on processing. Contact a dietitian for more information. What foods are lower in calcium? Foods that are lower in calcium contain 50 mg or less per serving. Fruits Apple, about 6 mg. Banana, about 12 mg.   Vegetables Lettuce, 19 mg per 2 oz serving. Tomato, about 11 mg. Grains Rice, 4 mg per 6 oz serving. Boiled potatoes, 14 mg per 8 oz serving. White bread, 6 mg per slice. Meats and other proteins Egg, 27 mg per 2 oz serving. Red meat, 7 mg per 4 oz serving. Chicken, 17 mg per 4 oz serving. Fish, cod, or trout, 20 mg per 4 oz serving. Dairy Cream cheese, regular, 14 mg per 1 Tbsp serving. Brie cheese, 50 mg per 1 oz serving. Parmesan cheese, 70 mg per 1 Tbsp serving. The items listed above may not be a complete list of foods lower in calcium. Actual amounts of calcium may be different depending on processing. Contact a dietitian for more information. Summary Calcium is an important mineral in the body because it affects many functions. Getting enough calcium helps support strong bones and teeth throughout your life. Try to get most of your calcium from food. Calcium supplements may interact with certain medicines. Check with your health care provider  or dietitian before starting any calcium supplements. This information is not intended to replace advice given to you by your health care provider. Make sure you discuss any questions you have with your health care provider. Document Revised: 11/24/2019 Document Reviewed: 11/24/2019 Elsevier Patient Education  2023 Elsevier Inc.  

## 2022-07-08 LAB — COLOGUARD: COLOGUARD: NEGATIVE

## 2023-01-29 ENCOUNTER — Other Ambulatory Visit: Payer: Self-pay | Admitting: Obstetrics and Gynecology

## 2023-01-29 DIAGNOSIS — Z1231 Encounter for screening mammogram for malignant neoplasm of breast: Secondary | ICD-10-CM

## 2023-02-28 ENCOUNTER — Ambulatory Visit
Admission: RE | Admit: 2023-02-28 | Discharge: 2023-02-28 | Disposition: A | Discharge status: Home / Self Care | Payer: BC Managed Care – PPO | Source: Ambulatory Visit | Attending: Obstetrics and Gynecology | Admitting: Obstetrics and Gynecology

## 2023-02-28 DIAGNOSIS — Z1231 Encounter for screening mammogram for malignant neoplasm of breast: Secondary | ICD-10-CM

## 2023-03-27 NOTE — Progress Notes (Deleted)
52 y.o. G0P0 Single Caucasian female here for annual exam.    PCP:     Patient's last menstrual period was 08/18/2014.           Sexually active: {yes no:314532}  The current method of family planning is status post hysterectomy.    Exercising: {yes no:314532}  {types:19826} Smoker:  no  Health Maintenance: Pap:  09/18/11 neg History of abnormal Pap:  no MMG:  02/28/23 Breast Density Cat B, BI-RADS CAT 1 neg Colonoscopy:  cologuard 06/27/22 BMD:   n/a  Result  n/a TDaP:  2014 Gardasil:   no HIV: neg in past Hep C: n/a Screening Labs:  Hb today: ***, Urine today: ***   reports that she has never smoked. She has never used smokeless tobacco. She reports that she does not drink alcohol and does not use drugs.  Past Medical History:  Diagnosis Date   Hypertension    Lower back pain    L4-L5    Past Surgical History:  Procedure Laterality Date   ABDOMINAL HYSTERECTOMY N/A 12/13/2014   Procedure: HYSTERECTOMY ABDOMINAL;  Surgeon: Patton Salles, MD;  Location: [facility];  Service: Gynecology;  Laterality: N/A;  3 hours   SALPINGOOPHORECTOMY Left 12/13/2014   Procedure: SALPINGO OOPHORECTOMY;  Surgeon: Patton Salles, MD;  Location: [facility];  Service: Gynecology;  Laterality: Left;   UNILATERAL SALPINGECTOMY Right 12/13/2014   Procedure: RIGHT SALPINGECTOMY;  Surgeon: Patton Salles, MD;  Location: [facility];  Service: Gynecology;  Laterality: Right;   WISDOM TOOTH EXTRACTION      Current Outpatient Medications  Medication Sig Dispense Refill   cholecalciferol (VITAMIN D3) 25 MCG (1000 UNIT) tablet 1 tablet Orally Once a day for 30 day(s)     hydrochlorothiazide (MICROZIDE) 12.5 MG capsule Take 12.5 mg by mouth every morning.     Methylsulfonylmethane (MSM PO) Take 1 tablet by mouth daily.     Multiple Vitamin (MULTIVITAMIN) capsule Take 1 capsule by mouth daily.     Omega-3 Fatty Acids (FISH OIL PO) Take by mouth.     VITAMIN E PO Take by mouth.     No  current facility-administered medications for this visit.    Family History  Problem Relation Age of Onset   Hypertension Mother    Thyroid disease Mother    Other Father 57       Dec--rare form of TB   Breast cancer Neg Hx     Review of Systems  Exam:   LMP 08/18/2014     General appearance: alert, cooperative and appears stated age Head: normocephalic, without obvious abnormality, atraumatic Neck: no adenopathy, supple, symmetrical, trachea midline and thyroid normal to inspection and palpation Lungs: clear to auscultation bilaterally Breasts: normal appearance, no masses or tenderness, No nipple retraction or dimpling, No nipple discharge or bleeding, No axillary adenopathy Heart: regular rate and rhythm Abdomen: soft, non-tender; no masses, no organomegaly Extremities: extremities normal, atraumatic, no cyanosis or edema Skin: skin color, texture, turgor normal. No rashes or lesions Lymph nodes: cervical, supraclavicular, and axillary nodes normal. Neurologic: grossly normal  Pelvic: External genitalia:  no lesions              No abnormal inguinal nodes palpated.              Urethra:  normal appearing urethra with no masses, tenderness or lesions              Bartholins and Skenes: normal  Vagina: normal appearing vagina with normal color and discharge, no lesions              Cervix: no lesions              Pap taken: {yes no:314532} Bimanual Exam:  Uterus:  normal size, contour, position, consistency, mobility, non-tender              Adnexa: no mass, fullness, tenderness              Rectal exam: {yes no:314532}.  Confirms.              Anus:  normal sphincter tone, no lesions  Chaperone was present for exam:  ***  Assessment:   Well woman visit with gynecologic exam.   Plan: Mammogram screening discussed. Self breast awareness reviewed. Pap and HR HPV as above. Guidelines for Calcium, Vitamin D, regular exercise program including  cardiovascular and weight bearing exercise.   Follow up annually and prn.   Additional counseling given.  {yes [id number]. _______ minutes face to face time of which over 50% was spent in counseling.    After visit summary provided.

## 2023-04-10 ENCOUNTER — Ambulatory Visit: Payer: BC Managed Care – PPO | Admitting: Obstetrics and Gynecology

## 2023-07-02 NOTE — Progress Notes (Unsigned)
52 y.o. G0P0 Single Caucasian female here for annual exam.    Some heart palpitations this summer.  Saw her PCP.   Some increased heat during the day, not at night.  Tolerable overall.  Not pursuing treatment options currently.   PCP: Veverly Fells, MD   Patient's last menstrual period was 08/18/2014.           Sexually active: No.  The current method of family planning is status post hysterectomy.    Menopausal hormone therapy:  n/a Exercising: Yes.     Walking, yoga Smoker:  no  OB History  Gravida Para Term Preterm AB Living  0            SAB IAB Ectopic Multiple Live Births                HEALTH MAINTENANCE: Last 2 paps:  09/18/11 neg: HR HPV neg History of abnormal Pap or positive HPV:  no Mammogram:   02/28/23 Breast Density cat B, BI-RADS CAT 1 neg Colonoscopy:  cologuard 06/27/22 - negative.  Bone Density:  n/a  Result  n/a   Immunization History  Administered Date(s) Administered   Influenza,inj,Quad PF,6+ Mos 05/25/2018   Influenza-Unspecified 05/12/2019   Moderna Sars-Covid-2 Vaccination 10/21/2019, 11/18/2019, 06/28/2020      reports that she has never smoked. She has never used smokeless tobacco. She reports that she does not drink alcohol and does not use drugs.  Past Medical History:  Diagnosis Date   Hypertension    Lower back pain    L4-L5    Past Surgical History:  Procedure Laterality Date   ABDOMINAL HYSTERECTOMY N/A 12/13/2014   Procedure: HYSTERECTOMY ABDOMINAL;  Surgeon: Patton Salles, MD;  Location: [facility];  Service: Gynecology;  Laterality: N/A;  3 hours   SALPINGOOPHORECTOMY Left 12/13/2014   Procedure: SALPINGO OOPHORECTOMY;  Surgeon: Patton Salles, MD;  Location: [facility];  Service: Gynecology;  Laterality: Left;   UNILATERAL SALPINGECTOMY Right 12/13/2014   Procedure: RIGHT SALPINGECTOMY;  Surgeon: Patton Salles, MD;  Location: [facility];  Service: Gynecology;  Laterality: Right;   WISDOM TOOTH EXTRACTION       Current Outpatient Medications  Medication Sig Dispense Refill   calcium carbonate (TUMS - DOSED IN MG ELEMENTAL CALCIUM) 500 MG chewable tablet Chew 1 tablet by mouth daily.     cholecalciferol (VITAMIN D3) 25 MCG (1000 UNIT) tablet 1 tablet Orally Once a day for 30 day(s)     hydrochlorothiazide (MICROZIDE) 12.5 MG capsule Take 12.5 mg by mouth every morning.     Methylsulfonylmethane (MSM PO) Take 1 tablet by mouth daily.     Multiple Vitamin (MULTIVITAMIN) capsule Take 1 capsule by mouth daily.     Omega-3 Fatty Acids (FISH OIL PO) Take by mouth.     VITAMIN E PO Take by mouth.     No current facility-administered medications for this visit.    ALLERGIES: Minocycline  Family History  Problem Relation Age of Onset   Hypertension Mother    Thyroid disease Mother    Other Father 32       Dec--rare form of TB   Breast cancer Neg Hx     Review of Systems  All other systems reviewed and are negative.   PHYSICAL EXAM:  BP 124/80 (BP Location: Left Arm, Patient Position: Sitting, Cuff Size: Large)   Pulse 84   Ht 5\' 7"  (1.702 m)   Wt 252 lb (114.3  kg)   LMP 08/18/2014   SpO2 99%   BMI 39.47 kg/m     General appearance: alert, cooperative and appears stated age Head: normocephalic, without obvious abnormality, atraumatic Neck: no adenopathy, supple, symmetrical, trachea midline and thyroid normal to inspection and palpation Lungs: clear to auscultation bilaterally Breasts: normal appearance, no masses or tenderness, No nipple retraction or dimpling, No nipple discharge or bleeding, No axillary adenopathy Heart: regular rate and rhythm Abdomen: soft, non-tender; no masses, no organomegaly Extremities: extremities normal, atraumatic, no cyanosis or edema Skin: skin color, texture, turgor normal. No rashes or lesions Lymph nodes: cervical, supraclavicular, and axillary nodes normal. Neurologic: grossly normal  Pelvic: External genitalia:  no lesions.  Subtle 2 x 2 cm  mildly erythema patch right labia majora.              No abnormal inguinal nodes palpated.              Urethra:  normal appearing urethra with no masses, tenderness or lesions              Bartholins and Skenes: normal                 Vagina: normal appearing vagina with normal color and discharge, no lesions              Cervix: absent              Pap taken: No. Bimanual Exam:  Uterus:  absent.               Adnexa: no mass, fullness, tenderness              Rectal exam: Yes.  .  Confirms.              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 Right ovary remains.  Vulvitis.  Patient endorses symptoms of irritation after the finding of the vulvar irritation today.  PLAN: Mammogram screening discussed. Self breast awareness reviewed. Pap and HRV collected:  No. Guidelines for Calcium, Vitamin D, regular exercise program including cardiovascular and weight bearing exercise. Medication: Triamcinolone ointment prn.  Will check FSH and estradiol. Follow up:  1 year and prn.

## 2023-07-16 ENCOUNTER — Encounter: Payer: Self-pay | Admitting: Obstetrics and Gynecology

## 2023-07-16 ENCOUNTER — Ambulatory Visit (INDEPENDENT_AMBULATORY_CARE_PROVIDER_SITE_OTHER): Payer: BC Managed Care – PPO | Admitting: Obstetrics and Gynecology

## 2023-07-16 VITALS — BP 124/80 | HR 84 | Ht 67.0 in | Wt 252.0 lb

## 2023-07-16 DIAGNOSIS — N951 Menopausal and female climacteric states: Secondary | ICD-10-CM

## 2023-07-16 DIAGNOSIS — Z01419 Encounter for gynecological examination (general) (routine) without abnormal findings: Secondary | ICD-10-CM

## 2023-07-16 MED ORDER — TRIAMCINOLONE ACETONIDE 0.025 % EX OINT: 1.0000 | TOPICAL_OINTMENT | Freq: Two times a day (BID) | CUTANEOUS | 1 refills | Status: AC

## 2023-07-16 NOTE — Patient Instructions (Signed)

## 2023-07-17 LAB — FOLLICLE STIMULATING HORMONE: FSH: 74.7 m[IU]/mL

## 2023-07-17 LAB — ESTRADIOL: Estradiol: 19 pg/mL

## 2024-02-06 ENCOUNTER — Other Ambulatory Visit: Payer: Self-pay | Admitting: Obstetrics and Gynecology

## 2024-02-06 DIAGNOSIS — Z1231 Encounter for screening mammogram for malignant neoplasm of breast: Secondary | ICD-10-CM

## 2024-03-05 ENCOUNTER — Ambulatory Visit
Admission: RE | Admit: 2024-03-05 | Discharge: 2024-03-05 | Disposition: A | Discharge status: Home / Self Care | Source: Ambulatory Visit | Attending: Obstetrics and Gynecology | Admitting: Obstetrics and Gynecology

## 2024-03-05 DIAGNOSIS — Z1231 Encounter for screening mammogram for malignant neoplasm of breast: Secondary | ICD-10-CM

## 2024-03-11 ENCOUNTER — Ambulatory Visit: Payer: Self-pay | Admitting: Obstetrics and Gynecology

## 2024-07-19 ENCOUNTER — Ambulatory Visit: Payer: BC Managed Care – PPO | Admitting: Obstetrics and Gynecology

## 2024-07-19 NOTE — Progress Notes (Deleted)
 53 y.o. G0P0 Single Caucasian female here for annual exam.    PCP: Jarold Omar RAMAN, MD   Patient's last menstrual period was 08/18/2014.           Sexually active: No.  The current method of family planning is status post hysterectomy.    Menopausal hormone therapy:  n/a Exercising: {yes no:314532}  {types:19826} Smoker:  no  OB History  Gravida Para Term Preterm AB Living  0       SAB IAB Ectopic Multiple Live Births           HEALTH MAINTENANCE: Last 2 paps:  09/18/11 neg  History of abnormal Pap or positive HPV:  no Mammogram:   03/05/24 Breast Density Cat B, BIRADS Cat 1 neg  Colonoscopy:  Cologuard - 06/27/22 neg  Bone Density:  n/a  Result  n/a   Immunization History  Administered Date(s) Administered   Influenza,inj,Quad PF,6+ Mos 05/25/2018   Influenza-Unspecified 05/12/2019   Moderna Sars-Covid-2 Vaccination 10/21/2019, 11/18/2019, 06/28/2020      reports that she has never smoked. She has never used smokeless tobacco. She reports that she does not drink alcohol and does not use drugs.  Past Medical History:  Diagnosis Date   Hypertension    Lower back pain    L4-L5    Past Surgical History:  Procedure Laterality Date   ABDOMINAL HYSTERECTOMY N/A 12/13/2014   Procedure: HYSTERECTOMY ABDOMINAL;  Surgeon: Bobie FORBES Cathlyn JAYSON Nikki, MD;  Location: [facility];  Service: Gynecology;  Laterality: N/A;  3 hours   SALPINGOOPHORECTOMY Left 12/13/2014   Procedure: SALPINGO OOPHORECTOMY;  Surgeon: Bobie FORBES Cathlyn JAYSON Nikki, MD;  Location: [facility];  Service: Gynecology;  Laterality: Left;   UNILATERAL SALPINGECTOMY Right 12/13/2014   Procedure: RIGHT SALPINGECTOMY;  Surgeon: Bobie FORBES Cathlyn JAYSON Nikki, MD;  Location: [facility];  Service: Gynecology;  Laterality: Right;   WISDOM TOOTH EXTRACTION      Current Outpatient Medications  Medication Sig Dispense Refill   calcium carbonate (TUMS - DOSED IN MG ELEMENTAL CALCIUM) 500 MG chewable tablet Chew 1 tablet by mouth daily.      cholecalciferol (VITAMIN D3) 25 MCG (1000 UNIT) tablet 1 tablet Orally Once a day for 30 day(s)     hydrochlorothiazide (MICROZIDE) 12.5 MG capsule Take 12.5 mg by mouth every morning.     Methylsulfonylmethane (MSM PO) Take 1 tablet by mouth daily.     Multiple Vitamin (MULTIVITAMIN) capsule Take 1 capsule by mouth daily.     Omega-3 Fatty Acids (FISH OIL PO) Take by mouth.     triamcinolone  (KENALOG ) 0.025 % ointment Apply 1 Application topically 2 (two) times daily. Use for 2 weeks a a time as needed. 30 g 1   VITAMIN E PO Take by mouth.     No current facility-administered medications for this visit.    ALLERGIES: Minocycline  Family History  Problem Relation Age of Onset   Hypertension Mother    Thyroid disease Mother    Other Father 36       Dec--rare form of TB   Breast cancer Paternal Aunt     Review of Systems  PHYSICAL EXAM:  LMP 08/18/2014     General appearance: alert, cooperative and appears stated age Head: normocephalic, without obvious abnormality, atraumatic Neck: no adenopathy, supple, symmetrical, trachea midline and thyroid normal to inspection and palpation Lungs: clear to auscultation bilaterally Breasts: normal appearance, no masses or tenderness, No nipple retraction or dimpling, No nipple discharge or bleeding,  No axillary adenopathy Heart: regular rate and rhythm Abdomen: soft, non-tender; no masses, no organomegaly Extremities: extremities normal, atraumatic, no cyanosis or edema Skin: skin color, texture, turgor normal. No rashes or lesions Lymph nodes: cervical, supraclavicular, and axillary nodes normal. Neurologic: grossly normal  Pelvic: External genitalia:  no lesions              No abnormal inguinal nodes palpated.              Urethra:  normal appearing urethra with no masses, tenderness or lesions              Bartholins and Skenes: normal                 Vagina: normal appearing vagina with normal color and discharge, no lesions               Cervix: no lesions              Pap taken: {yes no:314532} Bimanual Exam:  Uterus:  normal size, contour, position, consistency, mobility, non-tender              Adnexa: no mass, fullness, tenderness              Rectal exam: {yes no:314532}.  Confirms.              Anus:  normal sphincter tone, no lesions  Chaperone was present for exam:  {BSCHAPERONE:31226::Emily F, CMA}  ASSESSMENT: Well woman visit with gynecologic exam.  PHQ-2-9: ***  ***  PLAN: Mammogram screening discussed. Self breast awareness reviewed. Pap and HRV collected:  {yes no:314532} Guidelines for Calcium, Vitamin D, regular exercise program including cardiovascular and weight bearing exercise. Medication refills:  *** {LABS (Optional):23779} Follow up:  ***    Additional counseling given.  {yes [id number]. ***  total time was spent for this patient encounter, including preparation, face-to-face counseling with the patient, coordination of care, and documentation of the encounter in addition to doing the well woman visit with gynecologic exam.
